# Patient Record
Sex: Male | Born: 1970 | Race: White | Hispanic: No | Marital: Single | Smoking: Current every day smoker
Health system: Southern US, Community
[De-identification: ages and names within clinical notes are randomized; demographics above are authoritative.]

## PROBLEM LIST (undated history)

## (undated) DIAGNOSIS — F329 Major depressive disorder, single episode, unspecified: Secondary | ICD-10-CM

## (undated) DIAGNOSIS — F419 Anxiety disorder, unspecified: Secondary | ICD-10-CM

## (undated) DIAGNOSIS — G2581 Restless legs syndrome: Secondary | ICD-10-CM

## (undated) DIAGNOSIS — R252 Cramp and spasm: Secondary | ICD-10-CM

## (undated) DIAGNOSIS — F32A Depression, unspecified: Secondary | ICD-10-CM

## (undated) HISTORY — DX: Restless legs syndrome: G25.81

## (undated) HISTORY — DX: Cramp and spasm: R25.2

---
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
## 2007-03-07 ENCOUNTER — Emergency Department: Payer: Self-pay | Admitting: Emergency Medicine

## 2009-05-01 ENCOUNTER — Emergency Department: Payer: Self-pay | Admitting: Emergency Medicine

## 2011-06-27 ENCOUNTER — Emergency Department: Payer: Self-pay | Admitting: Emergency Medicine

## 2017-12-26 ENCOUNTER — Other Ambulatory Visit: Payer: Self-pay

## 2017-12-26 ENCOUNTER — Encounter: Payer: Self-pay | Admitting: Gerontology

## 2017-12-26 ENCOUNTER — Ambulatory Visit: Payer: Self-pay | Admitting: Gerontology

## 2017-12-26 VITALS — BP 129/76 | HR 78 | Temp 97.7°F | Ht 72.0 in | Wt 154.6 lb

## 2017-12-26 DIAGNOSIS — H539 Unspecified visual disturbance: Secondary | ICD-10-CM

## 2017-12-26 DIAGNOSIS — H6121 Impacted cerumen, right ear: Secondary | ICD-10-CM

## 2017-12-26 DIAGNOSIS — Z0000 Encounter for general adult medical examination without abnormal findings: Secondary | ICD-10-CM

## 2017-12-26 DIAGNOSIS — K029 Dental caries, unspecified: Secondary | ICD-10-CM

## 2017-12-26 DIAGNOSIS — R6882 Decreased libido: Secondary | ICD-10-CM

## 2017-12-26 NOTE — Patient Instructions (Signed)
Carbamide Peroxide ear solution  What is this medicine?  CARBAMIDE PEROXIDE (CAR bah mide per OX ide) is used to soften and help remove ear wax.  This medicine may be used for other purposes; ask your health care provider or pharmacist if you have questions.  COMMON BRAND NAME(S): Auro Ear, Auro Earache Relief, Debrox, Ear Drops, Ear Wax Removal, Ear Wax Remover, Earwax Treatment, Murine, Thera-Ear  What should I tell my health care provider before I take this medicine?  They need to know if you have any of these conditions:  -dizziness  -ear discharge  -ear pain, irritation or rash  -infection  -perforated eardrum (hole in eardrum)  -an unusual or allergic reaction to carbamide peroxide, glycerin, hydrogen peroxide, other medicines, foods, dyes, or preservatives  -pregnant or trying to get pregnant  -breast-feeding  How should I use this medicine?  This medicine is only for use in the outer ear canal. Follow the directions carefully. Wash hands before and after use. The solution may be warmed by holding the bottle in the hand for 1 to 2 minutes. Lie with the affected ear facing upward. Place the proper number of drops into the ear canal. After the drops are instilled, remain lying with the affected ear upward for 5 minutes to help the drops stay in the ear canal. A cotton ball may be gently inserted at the ear opening for no longer than 5 to 10 minutes to ensure retention. Repeat, if necessary, for the opposite ear. Do not touch the tip of the dropper to the ear, fingertips, or other surface. Do not rinse the dropper after use. Keep container tightly closed.  Talk to your pediatrician regarding the use of this medicine in children. While this drug may be used in children as young as 12 years for selected conditions, precautions do apply.  Overdosage: If you think you have taken too much of this medicine contact a poison control center or emergency room at once.   NOTE: This medicine is only for you. Do not share this medicine with others.  What if I miss a dose?  If you miss a dose, use it as soon as you can. If it is almost time for your next dose, use only that dose. Do not use double or extra doses.  What may interact with this medicine?  Interactions are not expected. Do not use any other ear products without asking your doctor or health care professional.  This list may not describe all possible interactions. Give your health care provider a list of all the medicines, herbs, non-prescription drugs, or dietary supplements you use. Also tell them if you smoke, drink alcohol, or use illegal drugs. Some items may interact with your medicine.  What should I watch for while using this medicine?  This medicine is not for long-term use. Do not use for more than 4 days without checking with your health care professional. Contact your doctor or health care professional if your condition does not start to get better within a few days or if you notice burning, redness, itching or swelling.  What side effects may I notice from receiving this medicine?  Side effects that you should report to your doctor or health care professional as soon as possible:  -allergic reactions like skin rash, itching or hives, swelling of the face, lips, or tongue  -burning, itching, and redness  -worsening ear pain  -rash  Side effects that usually do not require medical attention (report to your doctor   or health care professional if they continue or are bothersome):  -abnormal sensation while putting the drops in the ear  -temporary reduction in hearing (but not complete loss of hearing)  This list may not describe all possible side effects. Call your doctor for medical advice about side effects. You may report side effects to FDA at 1-800-FDA-1088.  Where should I keep my medicine?  Keep out of the reach of children.  Store at room temperature between 15 and 30 degrees C (59 and 86 degrees  F) in a tight, light-resistant container. Keep bottle away from excessive heat and direct sunlight. Throw away any unused medicine after the expiration date.  NOTE: This sheet is a summary. It may not cover all possible information. If you have questions about this medicine, talk to your doctor, pharmacist, or health care provider.  © 2018 [employer]vier/Gold Standard (2007-06-03 14:00:02)

## 2017-12-26 NOTE — Progress Notes (Signed)
Patient: Albert Castillo Male    DOB: September 25, 1970   47 y.o.   MRN: [id number] Visit Date: 12/26/2017  Today's Provider: Langston Reusing, NP   Chief Complaint  Patient presents with  . New Patient (Initial Visit)    needs to see dentist, balance is off, low libido, difficulty seeing and hearing   Subjective:    HPI Albert Castillo 47 y/o male presents to establish care and discuss about issues with his balance , decrease hearing, vision changes and low libido. He states having trouble with near vision, and has not has eye exam in years.   He c/o having cavities and need to see a dentist.   C/o problem with hearing, and inability to coordination with his balance, states that he holds on to  rails while going down the steps. Denies ear pain, dizziness, tinnitus and fall.  He c/o decrease in sex drive, states that he can get erection but it's not as firm as before. He declines being in a relationship.   Stated used generic Viagra during the 2 years he had sexual intercourse.  He  smokes 1 pack of cigarette in 3 days and drinks about 1 pint a day if he can afford it.  Not on File Previous Medications   No medications on file    Review of Systems  Constitutional: Negative.   HENT: Positive for hearing loss.   Eyes: Positive for visual disturbance.  Respiratory: Negative.   Cardiovascular: Negative.   Gastrointestinal: Negative.   Endocrine: Negative.   Genitourinary: Negative.   Musculoskeletal: Negative.   Skin: Negative.   Neurological: Negative.   Psychiatric/Behavioral: Negative.     Social History   Tobacco Use  . Smoking status: Current Every Day Smoker    Types: Cigarettes  . Smokeless tobacco: Never Used  Substance Use Topics  . Alcohol use: Yes    Comment: drinks 1 pint of hard liquor every day   Objective:   BP 129/76 (BP Location: Right Arm, Patient Position: Sitting)   Pulse 78   Temp 97.7 F (36.5 C)   Ht 6' (1.829 m)   Wt 154 lb 9.6 oz (70.1 kg)    SpO2 99%   BMI 20.97 kg/m   Physical Exam  Constitutional: He is oriented to person, place, and time. He appears well-developed and well-nourished.  HENT:  Head: Normocephalic and atraumatic.  Right Ear: Decreased hearing is noted.  Left Ear: External ear normal.  Eyes: Pupils are equal, round, and reactive to light. Conjunctivae and EOM are normal.  Neck: Normal range of motion.  Cardiovascular: Normal rate and regular rhythm.  Pulmonary/Chest: Effort normal and breath sounds normal.  Abdominal: Soft. Bowel sounds are normal.  Musculoskeletal: Normal range of motion.  Neurological: He is alert and oriented to person, place, and time.  Skin: Skin is warm and dry. No rash noted. No erythema.  Psychiatric: He has a normal mood and affect.  Decreased hearing to right ear, impacted cerumen      Assessment & Plan:     1. Health care maintenance  - Comp Met (CMET) - CBC w/Diff - TSH - Lipid Profile - Urinalysis - HgB A1c  2. Dental cavities Dental referral  3. Changes in vision Eye check exam  4. Libido, decreased  - Testosterone level drawn  5. Hearing loss due to cerumen impaction, right  Cerumen to right eye, Debrox for cerumen impaction Follow up in 1 month       Albert Habib E Breyona Swander, NP  [facility]

## 2017-12-27 LAB — URINALYSIS
BILIRUBIN UA: NEGATIVE
Glucose, UA: NEGATIVE
KETONES UA: NEGATIVE
LEUKOCYTES UA: NEGATIVE
NITRITE UA: NEGATIVE
PH UA: 8.5 — AB (ref 5.0–7.5)
Protein, UA: NEGATIVE
RBC UA: NEGATIVE
Specific Gravity, UA: 1.024 (ref 1.005–1.030)
Urobilinogen, Ur: 2 mg/dL — ABNORMAL HIGH (ref 0.2–1.0)

## 2017-12-27 LAB — COMPREHENSIVE METABOLIC PANEL
A/G RATIO: 0.8 — AB (ref 1.2–2.2)
ALT: 38 IU/L (ref 0–44)
AST: 111 IU/L — AB (ref 0–40)
Albumin: 3.4 g/dL — ABNORMAL LOW (ref 3.5–5.5)
Alkaline Phosphatase: 134 IU/L — ABNORMAL HIGH (ref 39–117)
BILIRUBIN TOTAL: 1.7 mg/dL — AB (ref 0.0–1.2)
BUN/Creatinine Ratio: 13 (ref 9–20)
BUN: 8 mg/dL (ref 6–24)
CALCIUM: 8.9 mg/dL (ref 8.7–10.2)
CHLORIDE: 105 mmol/L (ref 96–106)
CO2: 23 mmol/L (ref 20–29)
Creatinine, Ser: 0.62 mg/dL — ABNORMAL LOW (ref 0.76–1.27)
GFR, EST AFRICAN AMERICAN: 138 mL/min/{1.73_m2} (ref >59)
GFR, EST NON AFRICAN AMERICAN: 119 mL/min/{1.73_m2} (ref >59)
GLOBULIN, TOTAL: 4.3 g/dL (ref 1.5–4.5)
Glucose: 88 mg/dL (ref 65–99)
POTASSIUM: 3.7 mmol/L (ref 3.5–5.2)
SODIUM: 142 mmol/L (ref 134–144)
TOTAL PROTEIN: 7.7 g/dL (ref 6.0–8.5)

## 2017-12-27 LAB — CBC WITH DIFFERENTIAL/PLATELET
BASOS ABS: 0.1 10*3/uL (ref 0.0–0.2)
BASOS: 2 %
EOS (ABSOLUTE): 0.3 10*3/uL (ref 0.0–0.4)
Eos: 3 %
Hematocrit: 33.6 % — ABNORMAL LOW (ref 37.5–51.0)
Hemoglobin: 12.2 g/dL — ABNORMAL LOW (ref 13.0–17.7)
Immature Grans (Abs): 0 10*3/uL (ref 0.0–0.1)
Immature Granulocytes: 0 %
LYMPHS ABS: 3.5 10*3/uL — AB (ref 0.7–3.1)
LYMPHS: 41 %
MCH: 32.6 pg (ref 26.6–33.0)
MCHC: 36.3 g/dL — AB (ref 31.5–35.7)
MCV: 90 fL (ref 79–97)
MONOS ABS: 0.8 10*3/uL (ref 0.1–0.9)
Monocytes: 9 %
NEUTROS ABS: 4 10*3/uL (ref 1.4–7.0)
Neutrophils: 45 %
PLATELETS: 115 10*3/uL — AB (ref 150–450)
RBC: 3.74 x10E6/uL — AB (ref 4.14–5.80)
RDW: 13.4 % (ref 12.3–15.4)
WBC: 8.7 10*3/uL (ref 3.4–10.8)

## 2017-12-27 LAB — HEMOGLOBIN A1C
Est. average glucose Bld gHb Est-mCnc: 85 mg/dL
HEMOGLOBIN A1C: 4.6 % — AB (ref 4.8–5.6)

## 2017-12-27 LAB — LIPID PANEL
CHOL/HDL RATIO: 2.3 ratio (ref 0.0–5.0)
Cholesterol, Total: 150 mg/dL (ref 100–199)
HDL: 65 mg/dL (ref >39)
LDL Calculated: 73 mg/dL (ref 0–99)
Triglycerides: 62 mg/dL (ref 0–149)
VLDL Cholesterol Cal: 12 mg/dL (ref 5–40)

## 2017-12-27 LAB — TESTOSTERONE: TESTOSTERONE: 747 ng/dL (ref 264–916)

## 2017-12-27 LAB — TSH: TSH: 1.32 u[IU]/mL (ref 0.450–4.500)

## 2018-01-23 ENCOUNTER — Encounter: Payer: Self-pay | Admitting: Gerontology

## 2018-01-23 ENCOUNTER — Ambulatory Visit: Payer: Self-pay | Admitting: Gerontology

## 2018-01-23 ENCOUNTER — Other Ambulatory Visit: Payer: Self-pay

## 2018-01-23 ENCOUNTER — Ambulatory Visit: Payer: Self-pay | Admitting: Ophthalmology

## 2018-01-23 VITALS — BP 115/68 | HR 72 | Ht 72.0 in | Wt 154.2 lb

## 2018-01-23 DIAGNOSIS — K029 Dental caries, unspecified: Secondary | ICD-10-CM

## 2018-01-23 DIAGNOSIS — H6121 Impacted cerumen, right ear: Secondary | ICD-10-CM

## 2018-01-23 DIAGNOSIS — H539 Unspecified visual disturbance: Secondary | ICD-10-CM

## 2018-01-23 DIAGNOSIS — Z0000 Encounter for general adult medical examination without abnormal findings: Secondary | ICD-10-CM

## 2018-01-23 MED ORDER — CARBAMIDE PEROXIDE 6.5 % OT SOLN: 5.0000 [drp] | Freq: Two times a day (BID) | OTIC | Status: DC

## 2018-01-23 NOTE — Patient Instructions (Signed)
carbaCarbamide Peroxide ear solution What is this medicine? CARBAMIDE PEROXIDE (CAR bah mide per OX ide) is used to soften and help remove ear wax. This medicine may be used for other purposes; ask your health care provider or pharmacist if you have questions. COMMON BRAND NAME(S): Auro Ear, Auro Earache Relief, Debrox, Ear Drops, Ear Wax Removal, Ear Wax Remover, Earwax Treatment, Murine, Thera-Ear What should I tell my health care provider before I take this medicine? They need to know if you have any of these conditions: -dizziness -ear discharge -ear pain, irritation or rash -infection -perforated eardrum (hole in eardrum) -an unusual or allergic reaction to carbamide peroxide, glycerin, hydrogen peroxide, other medicines, foods, dyes, or preservatives -pregnant or trying to get pregnant -breast-feeding How should I use this medicine? This medicine is only for use in the outer ear canal. Follow the directions carefully. Wash hands before and after use. The solution may be warmed by holding the bottle in the hand for 1 to 2 minutes. Lie with the affected ear facing upward. Place the proper number of drops into the ear canal. After the drops are instilled, remain lying with the affected ear upward for 5 minutes to help the drops stay in the ear canal. A cotton ball may be gently inserted at the ear opening for no longer than 5 to 10 minutes to ensure retention. Repeat, if necessary, for the opposite ear. Do not touch the tip of the dropper to the ear, fingertips, or other surface. Do not rinse the dropper after use. Keep container tightly closed. Talk to your pediatrician regarding the use of this medicine in children. While this drug may be used in children as young as 12 years for selected conditions, precautions do apply. Overdosage: If you think you have taken too much of this medicine contact a poison control center or emergency room at once. NOTE: This medicine is only for you. Do not  share this medicine with others. What if I miss a dose? If you miss a dose, use it as soon as you can. If it is almost time for your next dose, use only that dose. Do not use double or extra doses. What may interact with this medicine? Interactions are not expected. Do not use any other ear products without asking your doctor or health care professional. This list may not describe all possible interactions. Give your health care provider a list of all the medicines, herbs, non-prescription drugs, or dietary supplements you use. Also tell them if you smoke, drink alcohol, or use illegal drugs. Some items may interact with your medicine. What should I watch for while using this medicine? This medicine is not for long-term use. Do not use for more than 4 days without checking with your health care professional. Contact your doctor or health care professional if your condition does not start to get better within a few days or if you notice burning, redness, itching or swelling. What side effects may I notice from receiving this medicine? Side effects that you should report to your doctor or health care professional as soon as possible: -allergic reactions like skin rash, itching or hives, swelling of the face, lips, or tongue -burning, itching, and redness -worsening ear pain -rash Side effects that usually do not require medical attention (report to your doctor or health care professional if they continue or are bothersome): -abnormal sensation while putting the drops in the ear -temporary reduction in hearing (but not complete loss of hearing) This list may not  describe all possible side effects. Call your doctor for medical advice about side effects. You may report side effects to FDA at 1-800-FDA-1088. Where should I keep my medicine? Keep out of the reach of children. Store at room temperature between 15 and 30 degrees C (59 and 86 degrees F) in a tight, light-resistant container. Keep bottle  away from excessive heat and direct sunlight. Throw away any unused medicine after the expiration date. NOTE: This sheet is a summary. It may not cover all possible information. If you have questions about this medicine, talk to your doctor, pharmacist, or health care provider.  2018 Elsevier/Gold Standard (2007-06-03 14:00:02)

## 2018-01-23 NOTE — Progress Notes (Signed)
Established Patient Office Visit  Subjective:  Patient ID: Albert Castillo, male    DOB: 1970-10-15  Age: 47 y.o. MRN: [id number]  CC:  Chief Complaint  Patient presents with  . Follow-up    continued teeth concerns    HPI Albert Castillo presents for follow up on his dental cavities, and lab review. He reports that he stopped drinking liquor, and his energy level and balance has improved. He also reports improvement in hearing through his right ear after using debrox for impacted cerumen to right ear.  He continue to complain of tooth ache, and awaits dental referral. He reports  smoking 7 cigarettes a day and desires to quit. He c/o visual changes with reading and has not had recent eye exam. Otherwise reports doing well, denies chest pain, palpitation, shortness of breath.  Lab reviewed, AST 111, he reports not drinking liquor. hgb 12.1 g/dl and hct 33.6 %.  Past Medical History:  Diagnosis Date  . Leg cramps   . Restless leg syndrome     History reviewed. No pertinent surgical history.  History reviewed. No pertinent family history.  Social History   Socioeconomic History  . Marital status: Single    Spouse name: Not on file  . Number of children: Not on file  . Years of education: Not on file  . Highest education level: Not on file  Occupational History  . Not on file  Social Needs  . Financial resource strain: Not on file  . Food insecurity:    Worry: Never true    Inability: Not on file  . Transportation needs:    Medical: No    Non-medical: Not on file  Tobacco Use  . Smoking status: Current Every Day Smoker    Types: Cigarettes  . Smokeless tobacco: Never Used  Substance and Sexual Activity  . Alcohol use: Yes    Comment: drinks 1 pint of hard liquor every day  . Drug use: Never  . Sexual activity: Not on file  Lifestyle  . Physical activity:    Days per week: Not on file    Minutes per session: Not on file  . Stress: Very much  Relationships  .  Social connections:    Talks on phone: Not on file    Gets together: Not on file    Attends religious service: Not on file    Active member of club or organization: Not on file    Attends meetings of clubs or organizations: Not on file    Relationship status: Not on file  . Intimate partner violence:    Fear of current or ex partner: Not on file    Emotionally abused: Not on file    Physically abused: Not on file    Forced sexual activity: Not on file  Other Topics Concern  . Not on file  Social History Narrative  . Not on file    No outpatient medications prior to visit.   No facility-administered medications prior to visit.     No Known Allergies  ROS Review of Systems  Constitutional: Negative.   HENT: Positive for dental problem (cavities).   Eyes: Positive for visual disturbance (blurred with reading).  Respiratory: Negative.   Cardiovascular: Negative.   Gastrointestinal: Negative.   Genitourinary: Negative.   Musculoskeletal: Negative.   Skin: Negative.   Neurological: Negative.   Psychiatric/Behavioral: Negative.       Objective:    Physical Exam  Constitutional: He is oriented to person, place, and  time. He appears well-developed and well-nourished.  HENT:  Head: Normocephalic and atraumatic.  Left Ear: Tympanic membrane is not retracted.  Ears:  Eyes: Pupils are equal, round, and reactive to light. EOM are normal.  Neck: Normal range of motion.  Cardiovascular: Normal rate and regular rhythm.  Pulmonary/Chest: Effort normal and breath sounds normal.  Abdominal: Soft. Bowel sounds are normal.  Musculoskeletal: Normal range of motion.  Neurological: He is alert and oriented to person, place, and time.  Skin: Skin is warm and dry.  Psychiatric: He has a normal mood and affect. His behavior is normal. Judgment and thought content normal.    BP 115/68 (BP Location: Left Arm, Patient Position: Sitting)   Pulse 72   Ht 6' (1.829 m)   Wt 154 lb 3.2 oz  (69.9 kg)   SpO2 100%   BMI 20.91 kg/m  Wt Readings from Last 3 Encounters:  01/23/18 154 lb 3.2 oz (69.9 kg)  12/26/17 154 lb 9.6 oz (70.1 kg)     Health Maintenance Due  Topic Date Due  . HIV Screening  09/19/1986  . TETANUS/TDAP  09/19/1990  . INFLUENZA VACCINE  10/03/2017    There are no preventive care reminders to display for this patient.  Lab Results  Component Value Date   TSH 1.320 12/26/2017   Lab Results  Component Value Date   WBC 8.7 12/26/2017   HGB 12.2 (L) 12/26/2017   HCT 33.6 (L) 12/26/2017   MCV 90 12/26/2017   PLT 115 (L) 12/26/2017   Lab Results  Component Value Date   NA 142 12/26/2017   K 3.7 12/26/2017   CO2 23 12/26/2017   GLUCOSE 88 12/26/2017   BUN 8 12/26/2017   CREATININE 0.62 (L) 12/26/2017   BILITOT 1.7 (H) 12/26/2017   ALKPHOS 134 (H) 12/26/2017   AST 111 (H) 12/26/2017   ALT 38 12/26/2017   PROT 7.7 12/26/2017   ALBUMIN 3.4 (L) 12/26/2017   CALCIUM 8.9 12/26/2017   Lab Results  Component Value Date   CHOL 150 12/26/2017   Lab Results  Component Value Date   HDL 65 12/26/2017   Lab Results  Component Value Date   LDLCALC 73 12/26/2017   Lab Results  Component Value Date   TRIG 62 12/26/2017   Lab Results  Component Value Date   CHOLHDL 2.3 12/26/2017   Lab Results  Component Value Date   HGBA1C 4.6 (L) 12/26/2017      Assessment & Plan:   Problem List Items Addressed This Visit    None    1. Dental cavities - Dental appointment scheduled  2. Changes in vision - Eye exam scheduled  3. Health care maintenance  - Comp Met (CMET); Future -cbc/with diff, future -encouraged smoking cessation, Quitline [place] provided - Encouraged on alcohol abstinence -HIV screening information provided -Declined flu vaccine   4. Impacted cerumen of right ear  - He was advised to use carbamide peroxide (DEBROX) 6.5 % OTIC (EAR) solution 5 drop   No orders of the defined types were placed in this  encounter.   Follow-up: No follow-ups on file.  Follow up in 6 months  Arine Foley E Hattie Perch, NP

## 2018-07-24 ENCOUNTER — Ambulatory Visit: Payer: Self-pay | Admitting: Gerontology

## 2018-07-24 NOTE — Progress Notes (Deleted)
Established Patient Office Visit  Subjective:  Patient ID: Albert Castillo, male    DOB: November 19, 1970  Age: 48 y.o. MRN: [id number]  CC: No chief complaint on file.   HPI Albert Castillo presents for follow up for dental cavities,  Past Medical History:  Diagnosis Date  . Leg cramps   . Restless leg syndrome     No past surgical history on file.  No family history on file.  Social History   Socioeconomic History  . Marital status: Single    Spouse name: Not on file  . Number of children: Not on file  . Years of education: Not on file  . Highest education level: Not on file  Occupational History  . Not on file  Social Needs  . Financial resource strain: Not on file  . Food insecurity:    Worry: Never true    Inability: Not on file  . Transportation needs:    Medical: No    Non-medical: Not on file  Tobacco Use  . Smoking status: Current Every Day Smoker    Types: Cigarettes  . Smokeless tobacco: Never Used  Substance and Sexual Activity  . Alcohol use: Yes    Comment: drinks 1 pint of hard liquor every day  . Drug use: Never  . Sexual activity: Not on file  Lifestyle  . Physical activity:    Days per week: Not on file    Minutes per session: Not on file  . Stress: Very much  Relationships  . Social connections:    Talks on phone: Not on file    Gets together: Not on file    Attends religious service: Not on file    Active member of club or organization: Not on file    Attends meetings of clubs or organizations: Not on file    Relationship status: Not on file  . Intimate partner violence:    Fear of current or ex partner: Not on file    Emotionally abused: Not on file    Physically abused: Not on file    Forced sexual activity: Not on file  Other Topics Concern  . Not on file  Social History Narrative  . Not on file    No outpatient medications prior to visit.   Facility-Administered Medications Prior to Visit  Medication Dose Route Frequency  Provider Last Rate Last Dose  . carbamide peroxide (DEBROX) 6.5 % OTIC (EAR) solution 5 drop  5 drop Right EAR BID Jacky Hartung E, NP        No Known Allergies  ROS Review of Systems    Objective:    Physical Exam  There were no vitals taken for this visit. Wt Readings from Last 3 Encounters:  01/23/18 154 lb 3.2 oz (69.9 kg)  12/26/17 154 lb 9.6 oz (70.1 kg)     Health Maintenance Due  Topic Date Due  . HIV Screening  09/19/1986  . TETANUS/TDAP  09/19/1990    There are no preventive care reminders to display for this patient.  Lab Results  Component Value Date   TSH 1.320 12/26/2017   Lab Results  Component Value Date   WBC 8.7 12/26/2017   HGB 12.2 (L) 12/26/2017   HCT 33.6 (L) 12/26/2017   MCV 90 12/26/2017   PLT 115 (L) 12/26/2017   Lab Results  Component Value Date   NA 142 12/26/2017   K 3.7 12/26/2017   CO2 23 12/26/2017   GLUCOSE 88 12/26/2017   BUN  8 12/26/2017   CREATININE 0.62 (L) 12/26/2017   BILITOT 1.7 (H) 12/26/2017   ALKPHOS 134 (H) 12/26/2017   AST 111 (H) 12/26/2017   ALT 38 12/26/2017   PROT 7.7 12/26/2017   ALBUMIN 3.4 (L) 12/26/2017   CALCIUM 8.9 12/26/2017   Lab Results  Component Value Date   CHOL 150 12/26/2017   Lab Results  Component Value Date   HDL 65 12/26/2017   Lab Results  Component Value Date   LDLCALC 73 12/26/2017   Lab Results  Component Value Date   TRIG 62 12/26/2017   Lab Results  Component Value Date   CHOLHDL 2.3 12/26/2017   Lab Results  Component Value Date   HGBA1C 4.6 (L) 12/26/2017      Assessment & Plan:   Problem List Items Addressed This Visit    None      No orders of the defined types were placed in this encounter.   Follow-up: No follow-ups on file.    Seleny Allbright Trellis Paganini, NP

## 2018-11-30 ENCOUNTER — Encounter: Payer: Self-pay | Admitting: Emergency Medicine

## 2018-11-30 ENCOUNTER — Emergency Department
Admission: EM | Admit: 2018-11-30 | Discharge: 2018-11-30 | Disposition: A | Discharge status: Home / Self Care | Payer: Self-pay | Attending: Emergency Medicine | Admitting: Emergency Medicine

## 2018-11-30 ENCOUNTER — Emergency Department: Payer: Self-pay

## 2018-11-30 ENCOUNTER — Other Ambulatory Visit: Payer: Self-pay

## 2018-11-30 DIAGNOSIS — R29898 Other symptoms and signs involving the musculoskeletal system: Secondary | ICD-10-CM | POA: Insufficient documentation

## 2018-11-30 DIAGNOSIS — M79651 Pain in right thigh: Secondary | ICD-10-CM | POA: Insufficient documentation

## 2018-11-30 DIAGNOSIS — M79652 Pain in left thigh: Secondary | ICD-10-CM | POA: Insufficient documentation

## 2018-11-30 DIAGNOSIS — G8929 Other chronic pain: Secondary | ICD-10-CM | POA: Insufficient documentation

## 2018-11-30 DIAGNOSIS — Z8669 Personal history of other diseases of the nervous system and sense organs: Secondary | ICD-10-CM

## 2018-11-30 DIAGNOSIS — F17210 Nicotine dependence, cigarettes, uncomplicated: Secondary | ICD-10-CM | POA: Insufficient documentation

## 2018-11-30 NOTE — ED Notes (Signed)
Pt called to inquire about old med on chart from [facility] in 2019. Pt was confused and thought he should've received the medicine before d/c. Explained. Pt requested prescription for pain. Educated cannot receive this once d/c and would need to come back and be seen again to potentially have prescription written.

## 2018-11-30 NOTE — ED Provider Notes (Signed)
[facility] EMERGENCY DEPARTMENT Provider Note   CSN: [id number] Arrival date & time: 11/30/18  0746     History   Chief Complaint Chief Complaint  Patient presents with   Leg Pain   Extremity Weakness    HPI Albert Castillo is a 48 y.o. male presents to the emergency department for evaluation of bilateral leg pain and weakness.  Patient states for at least 6 months he has had pain and weakness in both legs.  He describes sharp shooting pain in both thighs, groin to the knee.  He feels as if both of his legs give way with walking.  He denies any trauma or injury.  [facility] for [facility] same complaint, had negative MRI of the brain along with evaluation of lab work.  Patient was urged to follow-up with neurologist but is yet to follow-up.  Today, patient complains of continued pain with no improvement.  He denies any worsening symptoms or worsening weakness.  No loss of bowel or bladder symptoms.  He denies any back pain numbness tingling or radicular symptoms.  He complains of aching pain in both anterior thighs to the knee that is worse with sitting and improved after he first stands up and ambulates.  He ambulates with no assistive devices.  He takes occasional Tylenol and ibuprofen.  Pain is currently mild.  He denies any fevers.  Patient does admit to a history of alcoholism.   HPI  Past Medical History:  Diagnosis Date   Leg cramps    Restless leg syndrome     There are no active problems to display for this patient.   History reviewed. No pertinent surgical history.      Home Medications    Prior to Admission medications   Not on File    Family History No family history on file.  Social History Social History   Tobacco Use   Smoking status: Current Every Day Smoker    Types: Cigarettes   Smokeless tobacco: Never Used  Substance Use Topics   Alcohol use: Yes    Comment: drinks 1 pint of hard liquor every day   Drug  use: Never     Allergies   Patient has no known allergies.   Review of Systems Review of Systems  Constitutional: Negative for fever.  Respiratory: Negative for shortness of breath.   Cardiovascular: Negative for chest pain.  Gastrointestinal: Negative for abdominal pain.  Genitourinary: Negative for dysuria, frequency and hematuria.  Musculoskeletal: Positive for gait problem and myalgias. Negative for arthralgias, back pain, neck pain and neck stiffness.  Skin: Negative for rash and wound.  Neurological: Negative for dizziness, light-headedness and headaches.  Hematological: Bruises/bleeds easily.  Psychiatric/Behavioral: Negative for confusion.     Physical Exam Updated Vital Signs BP 133/72 (BP Location: Left Arm)    Pulse 81    Temp 98.2 F (36.8 C) (Oral)    Resp 16    Ht 6' (1.829 m)    Wt 70.3 kg    SpO2 100%    BMI 21.02 kg/m   Physical Exam Constitutional:      Appearance: He is well-developed.     Comments: Normal gait with no antalgic component.  No assistive devices with ambulation.  HENT:     Head: Normocephalic and atraumatic.  Eyes:     Conjunctiva/sclera: Conjunctivae normal.  Neck:     Musculoskeletal: Normal range of motion.  Cardiovascular:     Rate and Rhythm: Normal  rate.  Pulmonary:     Effort: Pulmonary effort is normal. No respiratory distress.  Abdominal:     General: There is no distension.     Tenderness: There is no abdominal tenderness. There is no guarding.  Musculoskeletal: Normal range of motion.     Comments: Lumbar Spine: Examination of the lumbar spine reveals no bony abnormality, no edema, and no ecchymosis.  There is no step off.  The patient has full range of motion of the lumbar spine with flexion and extension.  The patient has normal lateral bend and rotation.  The patient has no pain with range of motion activities.  The patient has a negative axial load test, and a negative rotational Waddell test.  The patient is non tender  along the spinous process.  The patient is non tender along the paravertebral muscles, with no muscle spasms.  The patient is non tender along the iliac crest.  The patient is non tender in the sciatic notch.  The patient is non tender along the Sacroiliac joint.  There is no Coccyx joint tenderness.    Bilateral Lower Extremities: Examination of the lower extremities reveals no bony abnormality, no edema, and no ecchymosis.  The patient has full active and passive range of motion of the hips, knees, and ankles.  No discomfort with active or passive range of motion of the hips knees and ankles bilaterally.  The patient is non tender along the greater trochanter region.  The patient has a negative Denna Haggard' test bilaterally.  There is normal skin warmth.  There is normal capillary refill bilaterally.    Neurologic: The patient has a negative straight leg raise.  The patient has normal muscle strength testing for the quadriceps, calves, ankle dorsiflexion, ankle plantarflexion, and extensor hallicus longus.  The patient has sensation that is intact to light touch.  Patellar reflexes are normal bilaterally with no clonus noted.  Skin:    General: Skin is warm.     Findings: No rash.  Neurological:     General: No focal deficit present.     Mental Status: He is alert and oriented to person, place, and time. Mental status is at baseline.     Cranial Nerves: No cranial nerve deficit.     Motor: No weakness.  Psychiatric:        Behavior: Behavior normal.        Thought Content: Thought content normal.      ED Treatments / Results  Labs (all labs ordered are listed, but only abnormal results are displayed) Labs Reviewed - No data to display  EKG None  Radiology Dg Pelvis 1-2 Views  Result Date: 11/30/2018 CLINICAL DATA:  Bilateral thigh pain for 6 months without known injury. EXAM: PELVIS - 1-2 VIEW COMPARISON:  None. FINDINGS: There is no evidence of pelvic fracture or diastasis. No pelvic  bone lesions are seen. No definite evidence of avascular necrosis. IMPRESSION: Negative. Electronically Signed   By: Lupita Raider M.D.   On: 11/30/2018 08:42    Procedures Procedures (including critical care time)  Medications Ordered in ED Medications - No data to display   Initial Impression / Assessment and Plan / ED Course  I have reviewed the triage vital signs and the nursing notes.  Pertinent labs & imaging results that were available during my care of the patient were reviewed by me and considered in my medical decision making (see chart for details).        48 year old male with  complaints of bilateral thigh pain and feeling as if his legs are giving way.  Symptoms been present for 6 months with no change.  He has had a negative MRI of the brain at [facility] 2 months ago.  Was encouraged to follow-up with neurology but is yet to follow-up.  X-rays of the pelvis today showed no signs of arthropathy or abnormal bony lesions.  No AVN.  He does suffer from alcoholism, symptoms could be coming from neuropathy.  He has no signs of blood clots.  His vital signs are stable.  No neurological deficits noted in the lower extremities.  He is encouraged to follow-up with neurology and he understands signs and symptoms return to ED for.  Final Clinical Impressions(s) / ED Diagnoses   Final diagnoses:  Bilateral thigh pain  Leg weakness, bilateral  Chronic pain of both lower extremities  History of sciatica    ED Discharge Orders    None       Renata Caprice 11/30/18 [id number]    Vanessa [place], MD 11/30/18 [phone]

## 2018-11-30 NOTE — Discharge Instructions (Addendum)
Please call neurology office to schedule follow-up appointment.  Return to the ER for any increasing pain weakness worsening symptoms or urgent changes in her health.

## 2018-11-30 NOTE — ED Notes (Signed)
Pt c/o weakness in both legs x6 months. Pt also reports hx of sciatic nerve pain.

## 2018-11-30 NOTE — ED Triage Notes (Signed)
Pt to ED via POV stating that he is having pain and weakness in his legs x 6 months. Pt is in NAD at this time.

## 2018-12-24 ENCOUNTER — Encounter: Payer: Self-pay | Admitting: Emergency Medicine

## 2018-12-24 ENCOUNTER — Other Ambulatory Visit: Payer: Self-pay

## 2018-12-24 DIAGNOSIS — Z820 Family history of epilepsy and other diseases of the nervous system: Secondary | ICD-10-CM

## 2018-12-24 DIAGNOSIS — G2581 Restless legs syndrome: Secondary | ICD-10-CM | POA: Diagnosis present

## 2018-12-24 DIAGNOSIS — F17210 Nicotine dependence, cigarettes, uncomplicated: Secondary | ICD-10-CM | POA: Diagnosis present

## 2018-12-24 DIAGNOSIS — K529 Noninfective gastroenteritis and colitis, unspecified: Secondary | ICD-10-CM | POA: Diagnosis present

## 2018-12-24 DIAGNOSIS — E876 Hypokalemia: Secondary | ICD-10-CM | POA: Diagnosis present

## 2018-12-24 DIAGNOSIS — D649 Anemia, unspecified: Secondary | ICD-10-CM | POA: Diagnosis present

## 2018-12-24 DIAGNOSIS — K7031 Alcoholic cirrhosis of liver with ascites: Principal | ICD-10-CM | POA: Diagnosis present

## 2018-12-24 DIAGNOSIS — K7011 Alcoholic hepatitis with ascites: Secondary | ICD-10-CM | POA: Diagnosis present

## 2018-12-24 DIAGNOSIS — Z20828 Contact with and (suspected) exposure to other viral communicable diseases: Secondary | ICD-10-CM | POA: Diagnosis present

## 2018-12-24 DIAGNOSIS — D6959 Other secondary thrombocytopenia: Secondary | ICD-10-CM | POA: Diagnosis present

## 2018-12-24 DIAGNOSIS — F1010 Alcohol abuse, uncomplicated: Secondary | ICD-10-CM | POA: Diagnosis present

## 2018-12-24 DIAGNOSIS — Z716 Tobacco abuse counseling: Secondary | ICD-10-CM

## 2018-12-24 NOTE — ED Triage Notes (Signed)
Patient ambulatory to triage with steady gait, without difficulty or distress noted, mask in place; pt st x 5 days having abd pain/swelling accomp by N/V/D

## 2018-12-25 ENCOUNTER — Inpatient Hospital Stay
Admission: EM | Admit: 2018-12-25 | Discharge: 2018-12-26 | DRG: 434 | Disposition: A | Discharge status: Home / Self Care | Payer: Self-pay | Attending: Internal Medicine | Admitting: Internal Medicine

## 2018-12-25 ENCOUNTER — Emergency Department: Payer: Self-pay

## 2018-12-25 ENCOUNTER — Other Ambulatory Visit (HOSPITAL_COMMUNITY): Payer: Self-pay

## 2018-12-25 ENCOUNTER — Inpatient Hospital Stay: Payer: Self-pay

## 2018-12-25 DIAGNOSIS — F10939 Alcohol use, unspecified with withdrawal, unspecified: Secondary | ICD-10-CM

## 2018-12-25 DIAGNOSIS — R140 Abdominal distension (gaseous): Secondary | ICD-10-CM

## 2018-12-25 DIAGNOSIS — H6121 Impacted cerumen, right ear: Secondary | ICD-10-CM

## 2018-12-25 DIAGNOSIS — F10239 Alcohol dependence with withdrawal, unspecified: Secondary | ICD-10-CM

## 2018-12-25 DIAGNOSIS — R109 Unspecified abdominal pain: Secondary | ICD-10-CM | POA: Diagnosis present

## 2018-12-25 DIAGNOSIS — R188 Other ascites: Secondary | ICD-10-CM

## 2018-12-25 DIAGNOSIS — K7031 Alcoholic cirrhosis of liver with ascites: Principal | ICD-10-CM

## 2018-12-25 HISTORY — DX: Anxiety disorder, unspecified: F41.9

## 2018-12-25 HISTORY — DX: Depression, unspecified: F32.A

## 2018-12-25 LAB — CBC WITH DIFFERENTIAL/PLATELET
Abs Immature Granulocytes: 0.02 10*3/uL (ref 0.00–0.07)
Basophils Absolute: 0.1 10*3/uL (ref 0.0–0.1)
Basophils Relative: 1 %
Eosinophils Absolute: 0.1 10*3/uL (ref 0.0–0.5)
Eosinophils Relative: 1 %
HCT: 32.2 % — ABNORMAL LOW (ref 39.0–52.0)
Hemoglobin: 11.3 g/dL — ABNORMAL LOW (ref 13.0–17.0)
Immature Granulocytes: 0 %
Lymphocytes Relative: 25 %
Lymphs Abs: 1.7 10*3/uL (ref 0.7–4.0)
MCH: 34.6 pg — ABNORMAL HIGH (ref 26.0–34.0)
MCHC: 35.1 g/dL (ref 30.0–36.0)
MCV: 98.5 fL (ref 80.0–100.0)
Monocytes Absolute: 0.7 10*3/uL (ref 0.1–1.0)
Monocytes Relative: 10 %
Neutro Abs: 4.4 10*3/uL (ref 1.7–7.7)
Neutrophils Relative %: 63 %
Platelets: 66 10*3/uL — ABNORMAL LOW (ref 150–400)
RBC: 3.27 MIL/uL — ABNORMAL LOW (ref 4.22–5.81)
RDW: 16.3 % — ABNORMAL HIGH (ref 11.5–15.5)
Smear Review: DECREASED
WBC: 6.9 10*3/uL (ref 4.0–10.5)
nRBC: 0 % (ref 0.0–0.2)

## 2018-12-25 LAB — COMPREHENSIVE METABOLIC PANEL
ALT: 46 U/L — ABNORMAL HIGH (ref 0–44)
ALT: 53 U/L — ABNORMAL HIGH (ref 0–44)
AST: 107 U/L — ABNORMAL HIGH (ref 15–41)
AST: 133 U/L — ABNORMAL HIGH (ref 15–41)
Albumin: 2.2 g/dL — ABNORMAL LOW (ref 3.5–5.0)
Albumin: 2.5 g/dL — ABNORMAL LOW (ref 3.5–5.0)
Alkaline Phosphatase: 85 U/L (ref 38–126)
Alkaline Phosphatase: 99 U/L (ref 38–126)
Anion gap: 9 (ref 5–15)
Anion gap: 9 (ref 5–15)
BUN: 5 mg/dL — ABNORMAL LOW (ref 6–20)
BUN: 6 mg/dL (ref 6–20)
CO2: 26 mmol/L (ref 22–32)
CO2: 26 mmol/L (ref 22–32)
Calcium: 7.8 mg/dL — ABNORMAL LOW (ref 8.9–10.3)
Calcium: 8.2 mg/dL — ABNORMAL LOW (ref 8.9–10.3)
Chloride: 100 mmol/L (ref 98–111)
Chloride: 101 mmol/L (ref 98–111)
Creatinine, Ser: 0.69 mg/dL (ref 0.61–1.24)
Creatinine, Ser: 0.73 mg/dL (ref 0.61–1.24)
GFR calc Af Amer: >60 mL/min (ref >60)
GFR calc Af Amer: >60 mL/min (ref >60)
GFR calc non Af Amer: >60 mL/min (ref >60)
GFR calc non Af Amer: >60 mL/min (ref >60)
Glucose, Bld: 109 mg/dL — ABNORMAL HIGH (ref 70–99)
Glucose, Bld: 126 mg/dL — ABNORMAL HIGH (ref 70–99)
Potassium: 3.4 mmol/L — ABNORMAL LOW (ref 3.5–5.1)
Potassium: 3.8 mmol/L (ref 3.5–5.1)
Sodium: 135 mmol/L (ref 135–145)
Sodium: 136 mmol/L (ref 135–145)
Total Bilirubin: 6.8 mg/dL — ABNORMAL HIGH (ref 0.3–1.2)
Total Bilirubin: 7.8 mg/dL — ABNORMAL HIGH (ref 0.3–1.2)
Total Protein: 6.4 g/dL — ABNORMAL LOW (ref 6.5–8.1)
Total Protein: 7.4 g/dL (ref 6.5–8.1)

## 2018-12-25 LAB — URINALYSIS, COMPLETE (UACMP) WITH MICROSCOPIC
Bacteria, UA: NONE SEEN
Specific Gravity, Urine: 1.027 (ref 1.005–1.030)
Squamous Epithelial / HPF: NONE SEEN (ref 0–5)

## 2018-12-25 LAB — AMYLASE, PLEURAL OR PERITONEAL FLUID: Amylase, Fluid: 14 U/L

## 2018-12-25 LAB — LACTATE DEHYDROGENASE, PLEURAL OR PERITONEAL FLUID: LD, Fluid: 58 U/L — ABNORMAL HIGH (ref 3–23)

## 2018-12-25 LAB — BODY FLUID CELL COUNT WITH DIFFERENTIAL
Eos, Fluid: 0 %
Lymphs, Fluid: 22 %
Monocyte-Macrophage-Serous Fluid: 68 %
Neutrophil Count, Fluid: 10 %
Total Nucleated Cell Count, Fluid: 310 cu mm

## 2018-12-25 LAB — HIV ANTIBODY (ROUTINE TESTING W REFLEX): HIV Screen 4th Generation wRfx: NONREACTIVE

## 2018-12-25 LAB — PROTIME-INR
INR: 2.2 — ABNORMAL HIGH (ref 0.8–1.2)
Prothrombin Time: 24.4 seconds — ABNORMAL HIGH (ref 11.4–15.2)

## 2018-12-25 LAB — GLUCOSE, PLEURAL OR PERITONEAL FLUID: Glucose, Fluid: 124 mg/dL

## 2018-12-25 LAB — MAGNESIUM: Magnesium: 1.4 mg/dL — ABNORMAL LOW (ref 1.7–2.4)

## 2018-12-25 LAB — CBC
HCT: 29.5 % — ABNORMAL LOW (ref 39.0–52.0)
Hemoglobin: 10.2 g/dL — ABNORMAL LOW (ref 13.0–17.0)
MCH: 34.1 pg — ABNORMAL HIGH (ref 26.0–34.0)
MCHC: 34.6 g/dL (ref 30.0–36.0)
MCV: 98.7 fL (ref 80.0–100.0)
Platelets: 57 10*3/uL — ABNORMAL LOW (ref 150–400)
RBC: 2.99 MIL/uL — ABNORMAL LOW (ref 4.22–5.81)
RDW: 16.6 % — ABNORMAL HIGH (ref 11.5–15.5)
WBC: 6.1 10*3/uL (ref 4.0–10.5)
nRBC: 0 % (ref 0.0–0.2)

## 2018-12-25 LAB — LIPASE, BLOOD: Lipase: 24 U/L (ref 11–51)

## 2018-12-25 LAB — AMMONIA: Ammonia: 16 umol/L (ref 9–35)

## 2018-12-25 LAB — SARS CORONAVIRUS 2 (TAT 6-24 HRS): SARS Coronavirus 2: NEGATIVE

## 2018-12-25 LAB — ALBUMIN, PLEURAL OR PERITONEAL FLUID: Albumin, Fluid: <1 g/dL

## 2018-12-25 LAB — PROTEIN, PLEURAL OR PERITONEAL FLUID: Total protein, fluid: <3 g/dL

## 2018-12-25 MED ORDER — ADULT MULTIVITAMIN W/MINERALS CH
1.0000 | ORAL_TABLET | Freq: Every day | ORAL | Status: DC
  Administered 2018-12-25 – 2018-12-26 (×2): 1 via ORAL
  Filled 2018-12-25 (×2): qty 1

## 2018-12-25 MED ORDER — LORAZEPAM 2 MG/ML IJ SOLN
0.0000 mg | Freq: Four times a day (QID) | INTRAMUSCULAR | Status: DC
  Administered 2018-12-25: 2 mg via INTRAVENOUS
  Filled 2018-12-25: qty 1

## 2018-12-25 MED ORDER — LORAZEPAM 2 MG/ML IJ SOLN: 1.0000 mg | INTRAMUSCULAR | Status: DC | PRN

## 2018-12-25 MED ORDER — LORAZEPAM BOLUS VIA INFUSION: 1.0000 mg | INTRAVENOUS | Status: DC | PRN

## 2018-12-25 MED ORDER — ONDANSETRON HCL 4 MG/2ML IJ SOLN
4.0000 mg | Freq: Once | INTRAMUSCULAR | Status: AC
  Administered 2018-12-25: 03:00:00 4 mg via INTRAVENOUS
  Filled 2018-12-25: qty 2

## 2018-12-25 MED ORDER — THIAMINE HCL 100 MG/ML IJ SOLN: 100.0000 mg | Freq: Every day | INTRAMUSCULAR | Status: DC

## 2018-12-25 MED ORDER — FUROSEMIDE 40 MG PO TABS
40.0000 mg | ORAL_TABLET | Freq: Every day | ORAL | Status: DC
  Administered 2018-12-25 – 2018-12-26 (×2): 40 mg via ORAL
  Filled 2018-12-25 (×2): qty 1

## 2018-12-25 MED ORDER — SODIUM CHLORIDE 0.9 % IV BOLUS
1000.0000 mL | Freq: Once | INTRAVENOUS | Status: AC
  Administered 2018-12-25: 1000 mL via INTRAVENOUS

## 2018-12-25 MED ORDER — MORPHINE SULFATE (PF) 2 MG/ML IV SOLN
2.0000 mg | Freq: Once | INTRAVENOUS | Status: AC
  Administered 2018-12-25: 2 mg via INTRAVENOUS
  Filled 2018-12-25: qty 1

## 2018-12-25 MED ORDER — PANTOPRAZOLE SODIUM 40 MG IV SOLR
40.0000 mg | Freq: Two times a day (BID) | INTRAVENOUS | Status: DC
  Administered 2018-12-25: 40 mg via INTRAVENOUS
  Filled 2018-12-25: qty 40

## 2018-12-25 MED ORDER — NICOTINE 14 MG/24HR TD PT24
14.0000 mg | MEDICATED_PATCH | Freq: Every day | TRANSDERMAL | Status: DC
  Administered 2018-12-25 – 2018-12-26 (×2): 14 mg via TRANSDERMAL
  Filled 2018-12-25 (×2): qty 1

## 2018-12-25 MED ORDER — THIAMINE HCL 100 MG/ML IJ SOLN
Freq: Once | INTRAVENOUS | Status: AC
  Administered 2018-12-25: 07:00:00 via INTRAVENOUS
  Filled 2018-12-25: qty 1000

## 2018-12-25 MED ORDER — POTASSIUM CHLORIDE IN NACL 20-0.9 MEQ/L-% IV SOLN
INTRAVENOUS | Status: DC
  Administered 2018-12-25: 05:00:00 via INTRAVENOUS
  Filled 2018-12-25 (×2): qty 1000

## 2018-12-25 MED ORDER — LORAZEPAM 2 MG/ML IJ SOLN: 0.0000 mg | Freq: Two times a day (BID) | INTRAMUSCULAR | Status: DC

## 2018-12-25 MED ORDER — SPIRONOLACTONE 25 MG PO TABS
25.0000 mg | ORAL_TABLET | Freq: Every day | ORAL | Status: DC
  Administered 2018-12-25 – 2018-12-26 (×2): 25 mg via ORAL
  Filled 2018-12-25 (×2): qty 1

## 2018-12-25 MED ORDER — LORAZEPAM 2 MG PO TABS
0.0000 mg | ORAL_TABLET | Freq: Four times a day (QID) | ORAL | Status: DC
  Administered 2018-12-26: 2 mg via ORAL
  Filled 2018-12-25: qty 1

## 2018-12-25 MED ORDER — SODIUM CHLORIDE 0.9 % IV SOLN
2.0000 g | INTRAVENOUS | Status: DC
  Administered 2018-12-25 – 2018-12-26 (×2): 2 g via INTRAVENOUS
  Filled 2018-12-25 (×3): qty 20

## 2018-12-25 MED ORDER — PANTOPRAZOLE SODIUM 40 MG PO TBEC
40.0000 mg | DELAYED_RELEASE_TABLET | Freq: Every day | ORAL | Status: DC
  Administered 2018-12-25 – 2018-12-26 (×2): 40 mg via ORAL
  Filled 2018-12-25 (×2): qty 1

## 2018-12-25 MED ORDER — CARBAMIDE PEROXIDE 6.5 % OT SOLN: 5.0000 [drp] | Freq: Two times a day (BID) | OTIC | Status: DC

## 2018-12-25 MED ORDER — IOHEXOL 300 MG/ML  SOLN
100.0000 mL | Freq: Once | INTRAMUSCULAR | Status: AC | PRN
  Administered 2018-12-25: 100 mL via INTRAVENOUS

## 2018-12-25 MED ORDER — VITAMIN B-1 100 MG PO TABS
100.0000 mg | ORAL_TABLET | Freq: Every day | ORAL | Status: DC
  Administered 2018-12-25 – 2018-12-26 (×2): 100 mg via ORAL
  Filled 2018-12-25 (×2): qty 1

## 2018-12-25 MED ORDER — LORAZEPAM 2 MG PO TABS: 0.0000 mg | ORAL_TABLET | Freq: Two times a day (BID) | ORAL | Status: DC

## 2018-12-25 MED ORDER — FOLIC ACID 1 MG PO TABS
1.0000 mg | ORAL_TABLET | Freq: Every day | ORAL | Status: DC
  Administered 2018-12-25 – 2018-12-26 (×2): 1 mg via ORAL
  Filled 2018-12-25 (×2): qty 1

## 2018-12-25 MED ORDER — MAGNESIUM SULFATE 4 GM/100ML IV SOLN
4.0000 g | Freq: Once | INTRAVENOUS | Status: AC
  Administered 2018-12-25: 4 g via INTRAVENOUS
  Filled 2018-12-25 (×2): qty 100

## 2018-12-25 NOTE — Procedures (Signed)
  Procedure: US paracentesis   EBL:   minimal Complications:  none immediate  See full dictation in [facility] PACS.  D. Aricela Bertagnolli MD Main # [phone] Pager  [phone]    

## 2018-12-25 NOTE — ED Provider Notes (Signed)
[facility] Emergency Department Provider Note   First MD Initiated Contact with Patient 12/25/18 0234     (approximate)  I have reviewed the triage vital signs and the nursing notes.   HISTORY  Chief Complaint Abdominal Pain   HPI Albert Castillo is a 48 y.o. male with below list of previous medical conditions including alcohol abuse presents to the emergency department secondary to 5-day history of generalized abdominal discomfort and "swelling".  Patient states that his current pain score is 9 out of 10.  Patient also admits to nausea and vomiting as well as dark stools.  Patient denies any chest pain no shortness of breath.  Patient denies any dyspnea.  Patient states that he normally drinks heavily however has not had any alcohol in 3 days.  Patient admits to previous shakes with alcohol cessation however denies any seizure       Past Medical History:  Diagnosis Date   Leg cramps    Restless leg syndrome     Patient Active Problem List   Diagnosis Date Noted   Abdominal pain 12/25/2018    History reviewed. No pertinent surgical history.  Prior to Admission medications   Not on File    Allergies Patient has no known allergies.  No family history on file.  Social History Social History   Tobacco Use   Smoking status: Current Every Day Smoker    Types: Cigarettes   Smokeless tobacco: Never Used  Substance Use Topics   Alcohol use: Yes    Comment: drinks 1 pint of hard liquor every day   Drug use: Never    Review of Systems Constitutional: No fever/chills Eyes: No visual changes. ENT: No sore throat. Cardiovascular: Denies chest pain. Respiratory: Denies shortness of breath. Gastrointestinal: Positive for abdominal pain distention nausea and vomiting diarrhea Genitourinary: Negative for dysuria. Musculoskeletal: Negative for neck pain.  Negative for back pain. Integumentary: Negative for rash. Neurological: Negative for  headaches, focal weakness or numbness. Psychiatric:  Positive for alcohol abuse   ____________________________________________   PHYSICAL EXAM:  VITAL SIGNS: ED Triage Vitals  Enc Vitals Group     BP 12/25/18 0005 (!) 149/77     Pulse Rate 12/25/18 0005 (!) 103     Resp 12/25/18 0005 18     Temp 12/25/18 0005 98.7 F (37.1 C)     Temp Source 12/25/18 0005 Oral     SpO2 12/25/18 0005 98 %     Weight 12/24/18 2356 70.3 kg (155 lb)     Height 12/24/18 2356 1.829 m (6')     Head Circumference --      Peak Flow --      Pain Score 12/24/18 2356 10     Pain Loc --      Pain Edu? --      Excl. in GC? --     Constitutional: Alert and oriented.  Eyes: Conjunctivae are normal.  Scleral icterus Head: Atraumatic. Mouth/Throat: Patient is wearing a mask. Neck: No stridor.  No meningeal signs.   Cardiovascular: Normal rate, regular rhythm. Good peripheral circulation. Grossly normal heart sounds. Respiratory: Normal respiratory effort.  No retractions. Gastrointestinal: Right upper quadrant/epigastric tenderness to palpation.  Positive ascites fluid wave. Musculoskeletal: No lower extremity tenderness nor edema. No gross deformities of extremities. Neurologic:  Normal speech and language. No gross focal neurologic deficits are appreciated.  Tremulous  skin:  Skin is warm, dry and intact.  Jaundice Psychiatric: Mood and affect are normal. Speech and  behavior are normal.  ____________________________________________   LABS (all labs ordered are listed, but only abnormal results are displayed)  Labs Reviewed  CBC WITH DIFFERENTIAL/PLATELET - Abnormal; Notable for the following components:      Result Value   RBC 3.27 (*)    Hemoglobin 11.3 (*)    HCT 32.2 (*)    MCH 34.6 (*)    RDW 16.3 (*)    Platelets 66 (*)    All other components within normal limits  COMPREHENSIVE METABOLIC PANEL - Abnormal; Notable for the following components:   Potassium 3.4 (*)    Glucose, Bld 126  (*)    Calcium 8.2 (*)    Albumin 2.5 (*)    AST 133 (*)    ALT 53 (*)    Total Bilirubin 7.8 (*)    All other components within normal limits  URINALYSIS, COMPLETE (UACMP) WITH MICROSCOPIC - Abnormal; Notable for the following components:   Color, Urine ORANGE (*)    APPearance CLEAR (*)    Glucose, UA   (*)    Value: TEST NOT REPORTED DUE TO COLOR INTERFERENCE OF URINE PIGMENT   Hgb urine dipstick   (*)    Value: TEST NOT REPORTED DUE TO COLOR INTERFERENCE OF URINE PIGMENT   Bilirubin Urine   (*)    Value: TEST NOT REPORTED DUE TO COLOR INTERFERENCE OF URINE PIGMENT   Ketones, ur   (*)    Value: TEST NOT REPORTED DUE TO COLOR INTERFERENCE OF URINE PIGMENT   Protein, ur   (*)    Value: TEST NOT REPORTED DUE TO COLOR INTERFERENCE OF URINE PIGMENT   Nitrite   (*)    Value: TEST NOT REPORTED DUE TO COLOR INTERFERENCE OF URINE PIGMENT   Leukocytes,Ua   (*)    Value: TEST NOT REPORTED DUE TO COLOR INTERFERENCE OF URINE PIGMENT   All other components within normal limits  COMPREHENSIVE METABOLIC PANEL - Abnormal; Notable for the following components:   Glucose, Bld 109 (*)    BUN 5 (*)    Calcium 7.8 (*)    Total Protein 6.4 (*)    Albumin 2.2 (*)    AST 107 (*)    ALT 46 (*)    Total Bilirubin 6.8 (*)    All other components within normal limits  MAGNESIUM - Abnormal; Notable for the following components:   Magnesium 1.4 (*)    All other components within normal limits  GI PATHOGEN PANEL BY PCR, STOOL  C DIFFICILE QUICK SCREEN W PCR REFLEX  SARS CORONAVIRUS 2 (TAT 6-24 HRS)  LIPASE, BLOOD  AMMONIA  PROTIME-INR  HIV ANTIBODY (ROUTINE TESTING W REFLEX)  CBC   ____  RADIOLOGY I, [employer] Mykell Rawl, personally viewed and evaluated these images (plain radiographs) as part of my medical decision making, as well as reviewing the written report by the radiologist.  ED MD interpretation: Hepatic cirrhosis moderate volume abdominal pelvic ascites.  Official radiology  report(s): Ct Abdomen Pelvis W Contrast  Result Date: 12/25/2018 CLINICAL DATA:  Acute abdominal pain. Nausea and vomiting. No ascites. Jaundice. EXAM: CT ABDOMEN AND PELVIS WITH CONTRAST TECHNIQUE: Multidetector CT imaging of the abdomen and pelvis was performed using the standard protocol following bolus administration of intravenous contrast. CONTRAST:  128mL OMNIPAQUE IOHEXOL 300 MG/ML  SOLN COMPARISON:  None. FINDINGS: Lower chest: Trace bilateral pleural effusions and adjacent atelectasis. Large paraesophageal varices. Hepatobiliary: Cirrhotic hepatic morphology with nodular contours. Diffusely heterogeneous liver parenchyma. No evidence of focal mass. Prominently  distended gallbladder without calcified gallstone. No biliary dilatation. Larger recannulated umbilical vein. No evidence of portal vein thrombosis. Pancreas: No ductal dilatation or inflammation. Spleen: No splenomegaly, spleen measures 11 cm greatest dimension. No focal splenic abnormality. Adrenals/Urinary Tract: Normal adrenal glands. No hydronephrosis or perinephric edema. Homogeneous renal enhancement with symmetric excretion on delayed phase imaging. Urinary bladder is partially distended without wall thickening. Stomach/Bowel: Large paraesophageal varices. Mild wall thickening about the distal stomach. Small bowel fused diffusely slightly edematous. No obstruction. Normal appendix. There is wall thickening of the cecum and ascending colon. Minimal diverticulosis without diverticulitis. Vascular/Lymphatic: Mild aortic atherosclerosis. No aneurysm. No evidence of portal vein thrombosis. Recannulated umbilical vein. Splenic vein is not well-defined adjacent to the pancreatic tail. No enlarged lymph nodes in the abdomen or pelvis. Reproductive: Prostate is unremarkable. Other: Moderate volume abdominopelvic and mesenteric ascites. Small fat containing umbilical hernia with adjacent edema. No free air. No organized fluid collection.  Musculoskeletal: There are no acute or suspicious osseous abnormalities. Degenerative disc disease at L5-S1. IMPRESSION: 1. Hepatic cirrhosis. Moderate volume abdominopelvic ascites. Portal hypertension with recannulated umbilical vein and large paraesophageal varices. No splenomegaly. 2. Wall thickening of the cecum and ascending colon, likely secondary to portal colopathy. 3. Trace bilateral pleural effusions and adjacent atelectasis. Aortic Atherosclerosis (ICD10-I70.0). Electronically Signed   By: Narda Rutherford M.D.   On: 12/25/2018 04:35    .Critical Care Performed by: Darci Current, MD Authorized by: Darci Current, MD   Critical care provider statement:    Critical care time (minutes):  45   Critical care time was exclusive of:  Separately billable procedures and treating other patients (Alcohol withdrawal)   Critical care was time spent personally by me on the following activities:  Development of treatment plan with patient or surrogate, discussions with consultants, evaluation of patient's response to treatment, examination of patient, obtaining history from patient or surrogate, ordering and performing treatments and interventions, ordering and review of laboratory studies, ordering and review of radiographic studies, pulse oximetry, re-evaluation of patient's condition and review of old charts     ____________________________________________   INITIAL IMPRESSION / MDM / ASSESSMENT AND PLAN / ED COURSE  As part of my medical decision making, I reviewed the following data within the electronic MEDICAL RECORD NUMBER   48 year old male presented with above-stated history and physical exam concerning for cirrhosis and ascites concern for possible liver failure given thrombocytopenia with a platelet count of 66, elevated liver enzymes including bilirubin of 7.8.  Also concern for possible alcohol withdrawal given tachycardia tremulousness and cessation of alcohol use 3 days ago.   CIWA protocol initiated.  Patient discussed with Dr. Arville Care for hospital admission for further evaluation and management.  ____________________________________________  FINAL CLINICAL IMPRESSION(S) / ED DIAGNOSES  Final diagnoses:  Alcohol withdrawal syndrome with complication (HCC)  Alcoholic cirrhosis of liver with ascites (HCC)     MEDICATIONS GIVEN DURING THIS VISIT:  Medications  LORazepam (ATIVAN) injection 0-4 mg (2 mg Intravenous Given 12/25/18 0354)    Or  LORazepam (ATIVAN) tablet 0-4 mg ( Oral See Alternative 12/25/18 0354)  LORazepam (ATIVAN) injection 0-4 mg (has no administration in time range)    Or  LORazepam (ATIVAN) tablet 0-4 mg (has no administration in time range)  thiamine (VITAMIN B-1) tablet 100 mg (has no administration in time range)    Or  thiamine (B-1) injection 100 mg (has no administration in time range)  0.9 % NaCl with KCl 20 mEq/ L  infusion ( Intravenous  New Bag/Given 12/25/18 0445)  pantoprazole (PROTONIX) injection 40 mg (has no administration in time range)  cefTRIAXone (ROCEPHIN) 2 g in sodium chloride 0.9 % 100 mL IVPB (has no administration in time range)  sodium chloride 0.9 % 1,000 mL with thiamine 100 mg, folic acid 1 mg, multivitamins adult 10 mL infusion (has no administration in time range)  LORazepam (ATIVAN) injection 1 mg (has no administration in time range)  sodium chloride 0.9 % bolus 1,000 mL (0 mLs Intravenous Stopped 12/25/18 0537)  ondansetron (ZOFRAN) injection 4 mg (4 mg Intravenous Given 12/25/18 0321)  morphine 2 MG/ML injection 2 mg (2 mg Intravenous Given 12/25/18 0320)  iohexol (OMNIPAQUE) 300 MG/ML solution 100 mL (100 mLs Intravenous Contrast Given 12/25/18 0356)     ED Discharge Orders    None      *Please note:  Albert GrillsDwayne Castillo was evaluated in Emergency Department on 12/25/2018 for the symptoms described in the history of present illness. He was evaluated in the context of the global COVID-19 pandemic, which  necessitated consideration that the patient might be at risk for infection with the SARS-CoV-2 virus that causes COVID-19. Institutional protocols and algorithms that pertain to the evaluation of patients at risk for COVID-19 are in a state of rapid change based on information released by regulatory bodies including the CDC and federal and state organizations. These policies and algorithms were followed during the patient's care in the ED.  Some ED evaluations and interventions may be delayed as a result of limited staffing during the pandemic.*  Note:  This document was prepared using Dragon voice recognition software and may include unintentional dictation errors.   Darci CurrentBrown, [place] N, MD 12/25/18 [phone]

## 2018-12-25 NOTE — Progress Notes (Signed)
The patient wanted to leave AMA.  Vitals and lab reviewed. I discussed with the patient about his current condition and the treatment plan.  The patient agreed to stay in the hospital for Further treatment.  Discussed with the patient and RN.

## 2018-12-25 NOTE — H&P (Addendum)
[facility] NAME: Albert Castillo    MR#:  [id number]  DATE OF BIRTH:  Jul 04, 1970  DATE OF ADMISSION:  12/25/2018  PRIMARY CARE PHYSICIAN: Patient, No Pcp Per   REQUESTING/REFERRING PHYSICIAN: Marjean Donna, MD  CHIEF COMPLAINT:   Chief Complaint  Patient presents with  . Abdominal Pain    HISTORY OF PRESENT ILLNESS:  Albert Castillo  is a 48 y.o. male with a known history of restless leg syndrome and alcohol abuse, presented to emergency room with onset of generalized abdominal pain and distention with a feeling of tightness with associated nausea and vomiting as well as diarrhea with very frequent bowel movements over the last couple of days.  He denied any bilious vomitus or hematemesis.  He noticed his stools are dark with no blood.  No dyspnea or chest pain or palpitations.  No cough or wheezing or hemoptysis.  He admitted to mild chills but did not have any measured fever.  Upon presentation to the emergency room vital signs revealed a blood pressure of 149/77 with a pulse of 103 with otherwise normal vital signs..  Labs revealed hypokalemia of 3.4 with normal serum lipase of 24 elevated AST 133, ALT 53 to obtain 7.4 with total bilirubin of 7.8.  CBC showed anemia with hemoglobin of 11.2 hematocrit 32.2 with thrombocytopenia with platelets of 66.  The patient was given 2 mg of IV morphine sulfate and 4 mg of IV Zofran, 1 L bolus of IV normal saline as well as thiamine 100 mg IV.  He will be admitted to a medical monitored bed for further evaluation and management. PAST MEDICAL HISTORY:   Past Medical History:  Diagnosis Date  . Leg cramps   . Restless leg syndrome   Tobacco and alcohol abuse  PAST SURGICAL HISTORY:  History reviewed. No pertinent surgical history.  SOCIAL HISTORY:   Social History   Tobacco Use  . Smoking status: Current Every Day Smoker    Types: Cigarettes  . Smokeless tobacco: Never Used   Substance Use Topics  . Alcohol use: Yes    Comment: drinks 1 pint of hard liquor every day    FAMILY HISTORY:  Positive for Alzheimer's dementia.  His mother had scoliosis  DRUG ALLERGIES:  No Known Allergies  REVIEW OF SYSTEMS:   ROS As per history of present illness. All pertinent systems were reviewed above. Constitutional,  HEENT, cardiovascular, respiratory, GI, GU, musculoskeletal, neuro, psychiatric, endocrine,  integumentary and hematologic systems were reviewed and are otherwise  negative/unremarkable except for positive findings mentioned above in the HPI.   MEDICATIONS AT HOME:   Prior to Admission medications   Not on File      VITAL SIGNS:  Blood pressure 137/79, pulse 89, temperature 98.7 F (37.1 C), temperature source Oral, resp. rate 18, height 6' (1.829 m), weight 70.3 kg, SpO2 96 %.  PHYSICAL EXAMINATION:  Physical Exam  GENERAL:  48 y.o.-year-old Caucasian male patient lying in the bed with no acute distress.  EYES: Pupils equal, round, reactive to light and accommodation.  Positive scleral icterus. Extraocular muscles intact.  HEENT: Head atraumatic, normocephalic. Oropharynx and nasopharynx clear.  NECK:  Supple, no jugular venous distention. No thyroid enlargement, no tenderness.  LUNGS: Normal breath sounds bilaterally, no wheezing, rales,rhonchi or crepitation. No use of accessory muscles of respiration.  CARDIOVASCULAR: Regular rate and rhythm, S1, S2 normal. No murmurs, rubs, or gallops.  ABDOMEN: Soft, distended with generalized tenderness without rebound  tenderness guarding or rigidity.  He had positive shifting dullness.  Bowel sounds present. No organomegaly or mass due to significant ascites.  EXTREMITIES: 1+ bilateral lower extremity pitting edema with no cyanosis, or clubbing.  NEUROLOGIC: Cranial nerves II through XII are intact. Muscle strength 5/5 in all extremities. Sensation intact. Gait not checked.  PSYCHIATRIC: The patient is  alert and oriented x 3.  Normal affect and good eye contact. SKIN: No obvious rash, lesion, or ulcer.   LABORATORY PANEL:   CBC Recent Labs  Lab 12/25/18 0008  WBC 6.9  HGB 11.3*  HCT 32.2*  PLT 66*   ------------------------------------------------------------------------------------------------------------------  Chemistries  Recent Labs  Lab 12/25/18 0008  NA 135  K 3.4*  CL 100  CO2 26  GLUCOSE 126*  BUN 6  CREATININE 0.73  CALCIUM 8.2*  AST 133*  ALT 53*  ALKPHOS 99  BILITOT 7.8*   ------------------------------------------------------------------------------------------------------------------  Cardiac Enzymes No results for input(s): TROPONINI in the last 168 hours. ------------------------------------------------------------------------------------------------------------------  RADIOLOGY:  No results found.    IMPRESSION AND PLAN:   1.  Abdominal pain likely secondary to symptomatic large ascites. -The patient will be admitted to a medical monitored bed. -We will obtain diagnostic and therapeutic ultrasound-guided paracentesis. -Peritoneal fluid labs were ordered. -Pain management will be provided. -We will start him on empiric IV Rocephin pending ruling out spontaneous bacterial peritonitis. -His diuretic therapy with Lasix and Aldactone can be resumed after paracentesis.    2.  Acute gastroenteritis.  This could be contributing to abdominal pain. -He has stool C. difficile and pathogen ordered. -He will be gently hydrated with IV normal saline with added potassium chloride. -PPI therapy will be provided.  3.  Hypokalemia. -Potassium will be replaced and magnesium level will be checked.  4.  Alcohol abuse. -He will be placed on as needed IV Ativan for alcohol withdrawal. -Banana bag will be provided daily.  5.  Tobacco abuse. -I counseled the patient for smoking cessation and the patient will receive further counseling here.  6.  DVT  prophylaxis. -SCDs for now.  Medical prophylaxis is currently contraindicated due to thrombocytopenia likely due to liver cell failure..   All the records are reviewed and case discussed with ED provider. The plan of care was discussed in details with the patient (and family). I answered all questions. The patient agreed to proceed with the above mentioned plan. Further management will depend upon hospital course.   CODE STATUS: Full code  TOTAL TIME TAKING CARE OF THIS PATIENT: 55 minutes.    Hannah Beat M.D on 12/25/2018 at 4:17 AM  Pager - [phone]  After 6pm go to [email or web address] - Social research officer, government  [facility]  Office  [phone]  CC: Primary care physician; Patient, No Pcp Per   Note: This dictation was prepared with Dragon dictation along with smaller phrase technology. Any transcriptional errors that result from this process are unintentional.

## 2018-12-25 NOTE — Consult Note (Signed)
Albert Lame, MD [facility]  [address] [place], [place] [address] Phone: [phone] Fax : [phone]  Consultation  Referring Provider:     Dr. Bridgett Larsson Primary Care Physician:  Patient, No Pcp Per Primary Gastroenterologist:  Sharion Settler         Reason for Consultation:     Cirrhosis  Date of Admission:  12/25/2018 Date of Consultation:  12/25/2018         HPI:   Albert Castillo is a 48 y.o. male who has a long history of alcohol abuse.  Patient states he drinks approximately a pint of vodka every day.  When he is not drinking vodka he says that he drinks beer.  The patient came in with abdominal distention and was found to have ascites.  The patient also noted that he had some dark stools but not black and cannot see any blood.  The patient's hemoglobin on admission was 11.3 and was 10.2 this morning his baseline year ago was 12.2.  The patient reports that he has had less pain since having the fluid taken off his abdomen.  The patient's creatinine is 1 was 0.69 with BUN of 5.  The patient's INR was also elevated at 2.2.  The patient enzyme showed his AST to be 133 with ALT of 53 consistent with alcoholic hepatitis.  The patient has been put on alcohol withdrawal protocols.  Past Medical History:  Diagnosis Date  . Anxiety   . Depression   . Leg cramps   . Restless leg syndrome     History reviewed. No pertinent surgical history.  Prior to Admission medications   Not on File    No family history on file.   Social History   Tobacco Use  . Smoking status: Current Every Day Smoker    Types: Cigarettes  . Smokeless tobacco: Never Used  Substance Use Topics  . Alcohol use: Yes    Comment: drinks 1 pint of hard liquor every day  . Drug use: Never    Allergies as of 12/24/2018  . (No Known Allergies)    Review of Systems:    All systems reviewed and negative except where noted in HPI.   Physical Exam:  Vital signs in last 24 hours: Temp:  [98.2 F (36.8  C)-98.7 F (37.1 C)] 98.2 F (36.8 C) (10/22 2043) Pulse Rate:  [74-103] 89 (10/22 2043) Resp:  [18-20] 20 (10/22 2043) BP: (123-149)/(72-87) 125/78 (10/22 2043) SpO2:  [96 %-100 %] 100 % (10/22 2043) Weight:  [70.3 kg-81.8 kg] 81.8 kg (10/22 0605) Last BM Date: 12/25/18 General:   Pleasant, cooperative in NAD Head:  Normocephalic and atraumatic. Eyes:   No icterus.   Conjunctiva pink. PERRLA. Ears:  Normal auditory acuity. Neck:  Supple; no masses or thyroidomegaly Lungs: Respirations even and unlabored. Lungs clear to auscultation bilaterally.   No wheezes, crackles, or rhonchi.  Heart:  Regular rate and rhythm;  Without murmur, clicks, rubs or gallops Abdomen:  Soft, nondistended, nontender. Normal bowel sounds. No appreciable masses or hepatomegaly.  No rebound or guarding.  Rectal:  Not performed. Msk:  Symmetrical without gross deformities.   Extremities:  Without edema, cyanosis or clubbing. Neurologic:  Alert and oriented x3;  grossly normal neurologically. Skin:  Intact without significant lesions or rashes. Cervical Nodes:  No significant cervical adenopathy. Psych:  Alert and cooperative. Normal affect.  LAB RESULTS: Recent Labs    12/25/18 0008 12/25/18 0446  WBC 6.9 6.1  HGB 11.3* 10.2*  HCT 32.2* 29.5*  PLT 66* 57*   BMET Recent Labs    12/25/18 0008 12/25/18 0446  NA 135 136  K 3.4* 3.8  CL 100 101  CO2 26 26  GLUCOSE 126* 109*  BUN 6 5*  CREATININE 0.73 0.69  CALCIUM 8.2* 7.8*   LFT Recent Labs    12/25/18 0446  PROT 6.4*  ALBUMIN 2.2*  AST 107*  ALT 46*  ALKPHOS 85  BILITOT 6.8*   PT/INR Recent Labs    12/25/18 0446  LABPROT 24.4*  INR 2.2*    STUDIES: Ct Abdomen Pelvis W Contrast  Result Date: 12/25/2018 CLINICAL DATA:  Acute abdominal pain. Nausea and vomiting. No ascites. Jaundice. EXAM: CT ABDOMEN AND PELVIS WITH CONTRAST TECHNIQUE: Multidetector CT imaging of the abdomen and pelvis was performed using the standard protocol  following bolus administration of intravenous contrast. CONTRAST:  100mL OMNIPAQUE IOHEXOL 300 MG/ML  SOLN COMPARISON:  None. FINDINGS: Lower chest: Trace bilateral pleural effusions and adjacent atelectasis. Large paraesophageal varices. Hepatobiliary: Cirrhotic hepatic morphology with nodular contours. Diffusely heterogeneous liver parenchyma. No evidence of focal mass. Prominently distended gallbladder without calcified gallstone. No biliary dilatation. Larger recannulated umbilical vein. No evidence of portal vein thrombosis. Pancreas: No ductal dilatation or inflammation. Spleen: No splenomegaly, spleen measures 11 cm greatest dimension. No focal splenic abnormality. Adrenals/Urinary Tract: Normal adrenal glands. No hydronephrosis or perinephric edema. Homogeneous renal enhancement with symmetric excretion on delayed phase imaging. Urinary bladder is partially distended without wall thickening. Stomach/Bowel: Large paraesophageal varices. Mild wall thickening about the distal stomach. Small bowel fused diffusely slightly edematous. No obstruction. Normal appendix. There is wall thickening of the cecum and ascending colon. Minimal diverticulosis without diverticulitis. Vascular/Lymphatic: Mild aortic atherosclerosis. No aneurysm. No evidence of portal vein thrombosis. Recannulated umbilical vein. Splenic vein is not well-defined adjacent to the pancreatic tail. No enlarged lymph nodes in the abdomen or pelvis. Reproductive: Prostate is unremarkable. Other: Moderate volume abdominopelvic and mesenteric ascites. Small fat containing umbilical hernia with adjacent edema. No free air. No organized fluid collection. Musculoskeletal: There are no acute or suspicious osseous abnormalities. Degenerative disc disease at L5-S1. IMPRESSION: 1. Hepatic cirrhosis. Moderate volume abdominopelvic ascites. Portal hypertension with recannulated umbilical vein and large paraesophageal varices. No splenomegaly. 2. 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 3. Trace bilateral pleural effusions and adjacent atelectasis. Aortic Atherosclerosis (ICD10-I70.0). Electronically Signed   By: Narda RutherfordMelanie  Sanford M.D.   On: 12/25/2018 04:35   [place] Paracentesis  Result Date: 12/25/2018 INDICATION: Abdominal pain, ascites, jaundice EXAM: ULTRASOUND GUIDED  PARACENTESIS MEDICATIONS: Lidocaine 1% subcutaneous COMPLICATIONS: None immediate. PROCEDURE: Informed written consent was obtained from the patient after a discussion of the risks, benefits and alternatives to treatment. A timeout was performed prior to the initiation of the procedure. Initial ultrasound scanning demonstrates a moderate amount of ascites within the abdomen. The right lateral abdomen was prepped and draped in the usual sterile fashion. 1% lidocaine was used for local anesthesia. Following this, a Safe-T-Centesis catheter was introduced. An ultrasound image was saved for documentation purposes. The paracentesis was performed. The catheter was removed and a dressing was applied. The patient tolerated the procedure well without immediate post procedural complication. FINDINGS: A total of approximately 2.2 L of clear straw-colored fluid was removed. Samples were sent to the laboratory as requested by the clinical team. IMPRESSION: Successful ultrasound-guided paracentesis yielding 2.2 liters of peritoneal fluid. Electronically Signed   By: Corlis Leak  Hassell M.D.   On: 12/25/2018  12:19      Impression / Plan:   Assessment: Active Problems:   Abdominal pain   Albert Castillo is a 48 y.o. y/o male with admission for ascites and abdominal distention.  Patient had a paracentesis that did not show any sign of SBP.  The patient has a long history of alcohol abuse.  Despite having dark stools he has a normal BUN and no sign of any GI bleeding.  Plan:  The patient has ascites and cirrhosis.  The patient also has signs of alcoholic hepatitis.   Patient has been told that he needs to stop drinking.  Patient has been started on Lasix and Aldactone.  There is nothing to do acutely for this patient and his absence of alcohol will be most beneficial for him.  The patient has been given my card and follow-up as an outpatient.  I will sign off.  Please call if any further GI concerns or questions.  We would like to thank you for the opportunity to participate in the care of Arnol Dunckel.    Thank you for involving me in the care of this patient.      LOS: 0 days   Midge Minium, MD  12/25/2018, 9:42 PM Pager [phone] 7am-5pm  Check AMION for 5pm -7am coverage and on weekends   Note: This dictation was prepared with Dragon dictation along with smaller phrase technology. Any transcriptional errors that result from this process are unintentional.

## 2018-12-25 NOTE — ED Notes (Signed)
ED TO INPATIENT HANDOFF REPORT  ED Nurse Name and Phone #: Cala Bradford [phone]  S Name/Age/Gender Albert Castillo 48 y.o. male Room/Bed: ED19A/ED19A  Code Status   Code Status: Full Code  Home/SNF/Other Home Patient oriented to: self, place, time and situation Is this baseline? Yes   Triage Complete: Triage complete  Chief Complaint Abd Pain and swelling  Triage Note Patient ambulatory to triage with steady gait, without difficulty or distress noted, mask in place; pt st x 5 days having abd pain/swelling accomp by N/V/D   Allergies No Known Allergies  Level of Care/Admitting Diagnosis ED Disposition    ED Disposition Condition Comment   Admit  Hospital Area: [facility] [[id number]]  Level of Care: Med-Surg [16]  Covid Evaluation: Asymptomatic Screening Protocol (No Symptoms)  Diagnosis: Abdominal pain [818299]  Admitting Physician: Hannah Beat [[id number]]  Attending Physician: Hannah Beat [[id number]]  Estimated length of stay: past midnight tomorrow  Certification:: I certify this patient will need inpatient services for at least 2 midnights  PT Class (Do Not Modify): Inpatient [101]  PT Acc Code (Do Not Modify): Private [1]       B Medical/Surgery History Past Medical History:  Diagnosis Date  . Leg cramps   . Restless leg syndrome    History reviewed. No pertinent surgical history.   A IV Location/Drains/Wounds Patient Lines/Drains/Airways Status   Active Line/Drains/Airways    Name:   Placement date:   Placement time:   Site:   Days:   Peripheral IV 12/25/18 Right Antecubital   12/25/18    0310    Antecubital   less than 1          Intake/Output Last 24 hours No intake or output data in the 24 hours ending 12/25/18 0517  Labs/Imaging Results for orders placed or performed during the hospital encounter of 12/25/18 (from the past 48 hour(s))  CBC with Differential     Status: Abnormal   Collection Time: 12/25/18 12:08 AM  Result  Value Ref Range   WBC 6.9 4.0 - 10.5 K/uL   RBC 3.27 (L) 4.22 - 5.81 MIL/uL   Hemoglobin 11.3 (L) 13.0 - 17.0 g/dL   HCT 75.1 (L) 02.5 - 85.2 %   MCV 98.5 80.0 - 100.0 fL   MCH 34.6 (H) 26.0 - 34.0 pg   MCHC 35.1 30.0 - 36.0 g/dL   RDW 77.8 (H) 24.2 - 35.3 %   Platelets 66 (L) 150 - 400 K/uL    Comment: Immature Platelet Fraction may be clinically indicated, consider ordering this additional test [id number]    nRBC 0.0 0.0 - 0.2 %   Neutrophils Relative % 63 %   Neutro Abs 4.4 1.7 - 7.7 K/uL   Lymphocytes Relative 25 %   Lymphs Abs 1.7 0.7 - 4.0 K/uL   Monocytes Relative 10 %   Monocytes Absolute 0.7 0.1 - 1.0 K/uL   Eosinophils Relative 1 %   Eosinophils Absolute 0.1 0.0 - 0.5 K/uL   Basophils Relative 1 %   Basophils Absolute 0.1 0.0 - 0.1 K/uL   WBC Morphology MORPHOLOGY UNREMARKABLE    Smear Review PLATELETS APPEAR DECREASED    Immature Granulocytes 0 %   Abs Immature Granulocytes 0.02 0.00 - 0.07 K/uL   Polychromasia PRESENT    Target Cells PRESENT     Comment: Performed at [facility], [address], [place], [place] [address]  Comprehensive metabolic panel     Status: Abnormal   Collection Time:  12/25/18 12:08 AM  Result Value Ref Range   Sodium 135 135 - 145 mmol/L   Potassium 3.4 (L) 3.5 - 5.1 mmol/L   Chloride 100 98 - 111 mmol/L   CO2 26 22 - 32 mmol/L   Glucose, Bld 126 (H) 70 - 99 mg/dL   BUN 6 6 - 20 mg/dL   Creatinine, Ser 0.73 0.61 - 1.24 mg/dL   Calcium 8.2 (L) 8.9 - 10.3 mg/dL   Total Protein 7.4 6.5 - 8.1 g/dL   Albumin 2.5 (L) 3.5 - 5.0 g/dL   AST 133 (H) 15 - 41 U/L   ALT 53 (H) 0 - 44 U/L   Alkaline Phosphatase 99 38 - 126 U/L   Total Bilirubin 7.8 (H) 0.3 - 1.2 mg/dL   GFR calc non Af Amer >60 >60 mL/min   GFR calc Af Amer >60 >60 mL/min   Anion gap 9 5 - 15    Comment: Performed at [facility], [address] [place], [place] [address]  Lipase, blood     Status: None   Collection Time: 12/25/18 12:08 AM  Result  Value Ref Range   Lipase 24 11 - 51 U/L    Comment: Performed at [facility], [address] [place], [place] [address]  Urinalysis, Complete w Microscopic     Status: Abnormal   Collection Time: 12/25/18 12:08 AM  Result Value Ref Range   Color, Urine ORANGE (A) YELLOW   APPearance CLEAR (A) CLEAR   Specific Gravity, Urine 1.027 1.005 - 1.030   pH  5.0 - 8.0    TEST NOT REPORTED DUE TO COLOR INTERFERENCE OF URINE PIGMENT   Glucose, UA (A) NEGATIVE mg/dL    TEST NOT REPORTED DUE TO COLOR INTERFERENCE OF URINE PIGMENT   Hgb urine dipstick (A) NEGATIVE    TEST NOT REPORTED DUE TO COLOR INTERFERENCE OF URINE PIGMENT   Bilirubin Urine (A) NEGATIVE    TEST NOT REPORTED DUE TO COLOR INTERFERENCE OF URINE PIGMENT   Ketones, ur (A) NEGATIVE mg/dL    TEST NOT REPORTED DUE TO COLOR INTERFERENCE OF URINE PIGMENT   Protein, ur (A) NEGATIVE mg/dL    TEST NOT REPORTED DUE TO COLOR INTERFERENCE OF URINE PIGMENT   Nitrite (A) NEGATIVE    TEST NOT REPORTED DUE TO COLOR INTERFERENCE OF URINE PIGMENT   Leukocytes,Ua (A) NEGATIVE    TEST NOT REPORTED DUE TO COLOR INTERFERENCE OF URINE PIGMENT   WBC, UA 0-5 0 - 5 WBC/hpf   Bacteria, UA NONE SEEN NONE SEEN   Squamous Epithelial / LPF NONE SEEN 0 - 5   Mucus PRESENT     Comment: Performed at [facility], [address], [place], [place] [address]   Ct Abdomen Pelvis W Contrast  Result Date: 12/25/2018 CLINICAL DATA:  Acute abdominal pain. Nausea and vomiting. No ascites. Jaundice. EXAM: CT ABDOMEN AND PELVIS WITH CONTRAST TECHNIQUE: Multidetector CT imaging of the abdomen and pelvis was performed using the standard protocol following bolus administration of intravenous contrast. CONTRAST:  135mL OMNIPAQUE IOHEXOL 300 MG/ML  SOLN COMPARISON:  None. FINDINGS: Lower chest: Trace bilateral pleural effusions and adjacent atelectasis. Large paraesophageal varices. Hepatobiliary: Cirrhotic hepatic morphology with nodular contours.  Diffusely heterogeneous liver parenchyma. No evidence of focal mass. Prominently distended gallbladder without calcified gallstone. No biliary dilatation. Larger recannulated umbilical vein. No evidence of portal vein thrombosis. Pancreas: No ductal dilatation or inflammation. Spleen: No splenomegaly, spleen measures 11 cm greatest dimension. No focal splenic  abnormality. Adrenals/Urinary Tract: Normal adrenal glands. No hydronephrosis or perinephric edema. Homogeneous renal enhancement with symmetric excretion on delayed phase imaging. Urinary bladder is partially distended without wall thickening. Stomach/Bowel: Large paraesophageal varices. Mild wall thickening about the distal stomach. Small bowel fused diffusely slightly edematous. No obstruction. Normal appendix. There is wall thickening of the cecum and ascending colon. Minimal diverticulosis without diverticulitis. Vascular/Lymphatic: Mild aortic atherosclerosis. No aneurysm. No evidence of portal vein thrombosis. Recannulated umbilical vein. Splenic vein is not well-defined adjacent to the pancreatic tail. No enlarged lymph nodes in the abdomen or pelvis. Reproductive: Prostate is unremarkable. Other: Moderate volume abdominopelvic and mesenteric ascites. Small fat containing umbilical hernia with adjacent edema. No free air. No organized fluid collection. Musculoskeletal: There are no acute or suspicious osseous abnormalities. Degenerative disc disease at L5-S1. IMPRESSION: 1. Hepatic cirrhosis. Moderate volume abdominopelvic ascites. Portal hypertension with recannulated umbilical vein and large paraesophageal varices. No splenomegaly. 2. Wall thickening of the cecum and ascending colon, likely secondary to portal colopathy. 3. Trace bilateral pleural effusions and adjacent atelectasis. Aortic Atherosclerosis (ICD10-I70.0). Electronically Signed   By: Narda RutherfordMelanie  Sanford M.D.   On: 12/25/2018 04:35    Pending Labs Unresulted Labs (From admission, onward)     Start     Ordered   12/25/18 0500  Comprehensive metabolic panel  Tomorrow morning,   STAT     12/25/18 0416   12/25/18 0500  CBC  Tomorrow morning,   STAT     12/25/18 0416   12/25/18 0417  Magnesium  Add-on,   AD    Comments: For level less than 2, give 2 g of IV magnesium sulfate    12/25/18 0416   12/25/18 0413  HIV Antibody (routine testing w rflx)  (HIV Antibody (Routine testing w reflex) panel)  Once,   STAT     12/25/18 0416   12/25/18 0408  Protime-INR  ONCE - STAT,   STAT     12/25/18 0407   12/25/18 0320  SARS CORONAVIRUS 2 (TAT 6-24 HRS) Nasopharyngeal Nasopharyngeal Swab  (Asymptomatic/Tier 2 Patients Labs)  Once,   STAT    Question Answer Comment  Is this test for diagnosis or screening Screening   Symptomatic for COVID-19 as defined by CDC No   Hospitalized for COVID-19 No   Admitted to ICU for COVID-19 No   Previously tested for COVID-19 No   Resident in a congregate (group) care setting No   Employed in healthcare setting No      12/25/18 0319   12/25/18 0247  Ammonia  ONCE - STAT,   STAT     12/25/18 0248   12/25/18 0245  GI pathogen panel by PCR, stool  (Gastrointestinal Panel by PCR, Stool                                                                                                                                                     *  Does Not include CLOSTRIDIUM DIFFICILE testing.**If CDIFF testing is needed, select the C Difficile Quick Screen w PCR reflex order below)  ONCE - STAT,   STAT     12/25/18 0244   12/25/18 0245  C difficile quick scan w PCR reflex  (C Difficile quick screen w PCR reflex panel)  Once, for 24 hours,   STAT     12/25/18 0245          Vitals/Pain Today's Vitals   12/24/18 2356 12/25/18 0005 12/25/18 0330  BP:  (!) 149/77 137/79  Pulse:  (!) 103 89  Resp:  18   Temp:  98.7 F (37.1 C)   TempSrc:  Oral   SpO2:  98% 96%  Weight: 70.3 kg    Height: 6' (1.829 m)    PainSc: 10-Worst pain ever      Isolation  Precautions Enteric precautions (UV disinfection)  Medications Medications  LORazepam (ATIVAN) injection 0-4 mg (2 mg Intravenous Given 12/25/18 0354)    Or  LORazepam (ATIVAN) tablet 0-4 mg ( Oral See Alternative 12/25/18 0354)  LORazepam (ATIVAN) injection 0-4 mg (has no administration in time range)    Or  LORazepam (ATIVAN) tablet 0-4 mg (has no administration in time range)  thiamine (VITAMIN B-1) tablet 100 mg (has no administration in time range)    Or  thiamine (B-1) injection 100 mg (has no administration in time range)  0.9 % NaCl with KCl 20 mEq/ L  infusion ( Intravenous New Bag/Given 12/25/18 0445)  pantoprazole (PROTONIX) injection 40 mg (has no administration in time range)  cefTRIAXone (ROCEPHIN) 2 g in sodium chloride 0.9 % 100 mL IVPB (has no administration in time range)  LORazepam (ATIVAN) bolus via infusion 1 mg (has no administration in time range)  sodium chloride 0.9 % 1,000 mL with thiamine 100 mg, folic acid 1 mg, multivitamins adult 10 mL infusion (has no administration in time range)  sodium chloride 0.9 % bolus 1,000 mL (1,000 mLs Intravenous New Bag/Given 12/25/18 0320)  ondansetron (ZOFRAN) injection 4 mg (4 mg Intravenous Given 12/25/18 0321)  morphine 2 MG/ML injection 2 mg (2 mg Intravenous Given 12/25/18 0320)  iohexol (OMNIPAQUE) 300 MG/ML solution 100 mL (100 mLs Intravenous Contrast Given 12/25/18 0356)    Mobility walks Low fall risk   Focused Assessments CIWA   R Recommendations: See Admitting Provider Note  Report given to:   Additional Notes:

## 2018-12-26 LAB — ACID FAST SMEAR (AFB, MYCOBACTERIA): Acid Fast Smear: NEGATIVE

## 2018-12-26 LAB — MAGNESIUM: Magnesium: 1.7 mg/dL (ref 1.7–2.4)

## 2018-12-26 LAB — PROTEIN, BODY FLUID (OTHER): Total Protein, Body Fluid Other: 1.1 g/dL

## 2018-12-26 MED ORDER — PANTOPRAZOLE SODIUM 40 MG PO TBEC: 40.0000 mg | DELAYED_RELEASE_TABLET | Freq: Every day | ORAL | 0 refills | Status: AC

## 2018-12-26 MED ORDER — ADULT MULTIVITAMIN W/MINERALS CH: 1.0000 | ORAL_TABLET | Freq: Every day | ORAL | 1 refills | Status: AC

## 2018-12-26 MED ORDER — NICOTINE 14 MG/24HR TD PT24: 14.0000 mg | MEDICATED_PATCH | Freq: Every day | TRANSDERMAL | 0 refills | Status: AC

## 2018-12-26 MED ORDER — SPIRONOLACTONE 25 MG PO TABS: 25.0000 mg | ORAL_TABLET | Freq: Every day | ORAL | 1 refills | Status: DC

## 2018-12-26 MED ORDER — THIAMINE HCL 100 MG PO TABS: 100.0000 mg | ORAL_TABLET | Freq: Every day | ORAL | 0 refills | Status: AC

## 2018-12-26 MED ORDER — FUROSEMIDE 40 MG PO TABS: 40.0000 mg | ORAL_TABLET | Freq: Every day | ORAL | 1 refills | Status: DC

## 2018-12-26 MED ORDER — MAGNESIUM SULFATE 2 GM/50ML IV SOLN
2.0000 g | Freq: Once | INTRAVENOUS | Status: AC
  Administered 2018-12-26: 2 g via INTRAVENOUS
  Filled 2018-12-26: qty 50

## 2018-12-26 MED ORDER — FOLIC ACID 1 MG PO TABS: 1.0000 mg | ORAL_TABLET | Freq: Every day | ORAL | 0 refills | Status: AC

## 2018-12-26 NOTE — Progress Notes (Signed)
Discharge orders written.  Dr. Bridgett Larsson instructs not to discharge him until he has  PT evaluation.

## 2018-12-26 NOTE — Progress Notes (Addendum)
Patient stumbled negotiating around the negative pressure fan. He caught himself on the window sill and scrapped his arm.  He did not fall.  Cleansed and covered with a bandaid.

## 2018-12-26 NOTE — Discharge Summary (Signed)
[facility] [place] [facility]   PATIENT NAME: Albert GrillsDwayne Castillo    MR#:  [id number]  DATE OF BIRTH:  [birth year]  DATE OF ADMISSION:  12/25/2018   ADMITTING PHYSICIAN: Hannah BeatJan A Mansy, MD  DATE OF DISCHARGE: 12/26/2018 PRIMARY CARE PHYSICIAN: Patient, No Pcp Per   ADMISSION DIAGNOSIS:  Impacted cerumen of right ear [H61.21] Alcoholic cirrhosis of liver with ascites (HCC) [K70.31] Ascites [R18.8] Alcohol withdrawal syndrome with complication (HCC) [F10.239] DISCHARGE DIAGNOSIS:  Active Problems:   Abdominal pain   Abdominal distention   Alcoholic cirrhosis of liver with ascites (HCC)  SECONDARY DIAGNOSIS:   Past Medical History:  Diagnosis Date  . Anxiety   . Depression   . Leg cramps   . Restless leg syndrome    HOSPITAL COURSE:  1.  Abdominal pain likely secondary to symptomatic large ascites. The patient is treated with empiric IV Rocephin. He got paracentesis with 2.2 L fluid withdrawal.  No evidence of SBP.  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 Started Lasix and Aldactone. Follow-up as outpatient per Dr. Servando SnareWohl.  2.  Acute gastroenteritis.  This could be contributing to abdominal pain. Improved.  Started Protonix.  3.  Hypokalemia. Improved with potassium.  Hypomagnesemia.  Improved with IV magnesium.  4.  Alcohol abuse. The patient is on CIWA protocol.  Continue folic acid, thiamine and multivitamin.  Thrombocytopenia.  Possible due to liver cirrhosis.  Follow-up CBC as outpatient.  5.  Tobacco abuse. Smoking cessation was counseled for 3 to 4 minutes by me, nicotine patch. DISCHARGE CONDITIONS:  Stable, discharged to home today. CONSULTS OBTAINED:   DRUG ALLERGIES:  No Known Allergies DISCHARGE MEDICATIONS:   Allergies as of 12/26/2018   No Known Allergies     Medication List    TAKE these medications   folic acid 1 MG tablet Commonly known as: FOLVITE Take 1 tablet (1  mg total) by mouth daily.   furosemide 40 MG tablet Commonly known as: LASIX Take 1 tablet (40 mg total) by mouth daily.   multivitamin with minerals Tabs tablet Take 1 tablet by mouth daily.   nicotine 14 mg/24hr patch Commonly known as: NICODERM CQ - dosed in mg/24 hours Place 1 patch (14 mg total) onto the skin daily.   pantoprazole 40 MG tablet Commonly known as: PROTONIX Take 1 tablet (40 mg total) by mouth daily.   spironolactone 25 MG tablet Commonly known as: ALDACTONE Take 1 tablet (25 mg total) by mouth daily.   thiamine 100 MG tablet Take 1 tablet (100 mg total) by mouth daily.        DISCHARGE INSTRUCTIONS:  See AVS.  If you experience worsening of your admission symptoms, develop shortness of breath, life threatening emergency, suicidal or homicidal thoughts you must seek medical attention immediately by calling 911 or calling your MD immediately  if symptoms less severe.  You Must read complete instructions/literature along with all the possible adverse reactions/side effects for all the Medicines you take and that have been prescribed to you. Take any new Medicines after you have completely understood and accpet all the possible adverse reactions/side effects.   Please note  You were cared for by a hospitalist during your hospital stay. If you have any questions about your discharge medications or the care you received while you were in the hospital after you are discharged, you can call the unit and asked to speak with the hospitalist on call if the hospitalist that took  care of you is not available. Once you are discharged, your primary care physician will handle any further medical issues. Please note that NO REFILLS for any discharge medications will be authorized once you are discharged, as it is imperative that you return to your primary care physician (or establish a relationship with a primary care physician if you do not have one) for your aftercare needs  so that they can reassess your need for medications and monitor your lab values.    On the day of Discharge:  VITAL SIGNS:  Blood pressure 127/81, pulse 89, temperature 97.9 F (36.6 C), temperature source Oral, resp. rate 20, height 6' (1.829 m), weight 79.9 kg, SpO2 98 %. PHYSICAL EXAMINATION:  GENERAL:  48 y.o.-year-old patient lying in the bed with no acute distress.  EYES: Pupils equal, round, reactive to light and accommodation. No scleral icterus. Extraocular muscles intact.  HEENT: Head atraumatic, normocephalic. NECK:  Supple, no jugular venous distention. No thyroid enlargement, no tenderness.  LUNGS: Normal breath sounds bilaterally, no wheezing, rales,rhonchi or crepitation. No use of accessory muscles of respiration.  CARDIOVASCULAR: S1, S2 normal. No murmurs, rubs, or gallops.  ABDOMEN: Soft, non-tender, non-distended. Bowel sounds present. No organomegaly or mass.  EXTREMITIES: No pedal edema, cyanosis, or clubbing.  NEUROLOGIC: Cranial nerves II through XII are intact. Muscle strength 5/5 in all extremities. Sensation intact. Gait not checked.  PSYCHIATRIC: The patient is alert and oriented x 3.  SKIN: No obvious rash, lesion, or ulcer.  DATA REVIEW:   CBC Recent Labs  Lab 12/25/18 0446  WBC 6.1  HGB 10.2*  HCT 29.5*  PLT 57*    Chemistries  Recent Labs  Lab 12/25/18 0446 12/26/18 0629  NA 136  --   K 3.8  --   CL 101  --   CO2 26  --   GLUCOSE 109*  --   BUN 5*  --   CREATININE 0.69  --   CALCIUM 7.8*  --   MG 1.4* 1.7  AST 107*  --   ALT 46*  --   ALKPHOS 85  --   BILITOT 6.8*  --      Microbiology Results  Results for orders placed or performed during the hospital encounter of 12/25/18  SARS CORONAVIRUS 2 (TAT 6-24 HRS) Nasopharyngeal Nasopharyngeal Swab     Status: None   Collection Time: 12/25/18  4:46 AM   Specimen: Nasopharyngeal Swab  Result Value Ref Range Status   SARS Coronavirus 2 NEGATIVE NEGATIVE Final    Comment: (NOTE)  SARS-CoV-2 target nucleic acids are NOT DETECTED. The SARS-CoV-2 RNA is generally detectable in upper and lower respiratory specimens during the acute phase of infection. Negative results do not preclude SARS-CoV-2 infection, do not rule out co-infections with other pathogens, and should not be used as the sole basis for treatment or other patient management decisions. Negative results must be combined with clinical observations, patient history, and epidemiological information. The expected result is Negative. Fact Sheet for Patients: HairSlick.no Fact Sheet for Healthcare Providers: quierodirigir.com This test is not yet approved or cleared by the [place] FDA and  has been authorized for detection and/or diagnosis of SARS-CoV-2 by FDA under an Emergency Use Authorization (EUA). This EUA will remain  in effect (meaning this test can be used) for the duration of the COVID-19 declaration under Section 56 4(b)(1) of the Act, 21 U.S.C. section 360bbb-3(b)(1), unless the authorization is terminated or revoked sooner. Performed at [facility], [address] [place], [place] [address]   Body fluid culture     Status: None (Preliminary result)   Collection Time: 12/25/18 12:06 PM   Specimen: PATH Cytology Peritoneal fluid  Result Value Ref Range Status   Specimen Description   Final    PERITONEAL Performed at [facility], [address], [place], [place] [address]    Special Requests   Final    NONE Performed at [facility], [address] [place], [place] [address]    Gram Stain   Final    RARE WBC PRESENT, PREDOMINANTLY MONONUCLEAR NO ORGANISMS SEEN    Culture   Final    NO GROWTH < 24 HOURS Performed at [facility], [place] [address] [place], [place] [address]    Report Status PENDING  Incomplete  Acid Fast Smear (AFB)     Status: None   Collection Time: 12/25/18 12:06 PM    Specimen: PATH Cytology Peritoneal fluid  Result Value Ref Range Status   AFB Specimen Processing Concentration  Final   Acid Fast Smear Negative  Final    Comment: (NOTE) Performed At: [facility] [address] [place], [place] [id number] Rush Farmer MD [id number]    Source (AFB) PERITONEAL  Final    Comment: Performed at [facility], [address] [place], [place] [address]    RADIOLOGY:  US Paracentesis  Result Date: 12/25/2018 INDICATION: Abdominal pain, ascites, jaundice EXAM: ULTRASOUND GUIDED  PARACENTESIS MEDICATIONS: Lidocaine 1% subcutaneous COMPLICATIONS: None immediate. PROCEDURE: Informed written consent was obtained from the patient after a discussion of the risks, benefits and alternatives to treatment. A timeout was performed prior to the initiation of the procedure. Initial ultrasound scanning demonstrates a moderate amount of ascites within the abdomen. The right lateral abdomen was prepped and draped in the usual sterile fashion. 1% lidocaine was used for local anesthesia. Following this, a Safe-T-Centesis catheter was introduced. An ultrasound image was saved for documentation purposes. The paracentesis was performed. The catheter was removed and a dressing was applied. The patient tolerated the procedure well without immediate post procedural complication. FINDINGS: A total of approximately 2.2 L of clear straw-colored fluid was removed. Samples were sent to the laboratory as requested by the clinical team. IMPRESSION: Successful ultrasound-guided paracentesis yielding 2.2 liters of peritoneal fluid. Electronically Signed   By: Lucrezia Europe M.D.   On: 12/25/2018 12:19     Management plans discussed with the patient, family and they are in agreement.  CODE STATUS: Full Code   TOTAL TIME TAKING CARE OF THIS PATIENT: 33 minutes.    Demetrios Loll M.D on 12/26/2018 at 11:12 AM  Between 7am to 6pm - Pager - [phone]  After 6pm go to  [email or web address] - Proofreader  [facility] [place] [facility]  Office  [phone]  CC: Primary care physician; Patient, No Pcp Per   Note: This dictation was prepared with Dragon dictation along with smaller phrase technology. Any transcriptional errors that result from this process are unintentional.

## 2018-12-26 NOTE — Evaluation (Signed)
Physical Therapy Evaluation Patient Details Name: Albert Castillo MRN: [id number] DOB: Dec 29, 1970 Today's Date: 12/26/2018   History of Present Illness  Patient is 48 yo male that presented to ED for abdominal pain/distention, underwent paracentisis during hospital admission. PMH of anxiety, depression, ETOH abuse.    Clinical Impression  Patient alert, oriented, impulsive during session. L arm abrasion noted, RN notified and bandage placed. The patient stated he is independent at baseline, has a friend that checks on him 1-2x a week, he drives, doesn't work. Reported his balance problems have been present for about a year.  The patient demonstrated bed mobility and transfers mod I. Socks and shoes donned at EOB, PT assisted though not required. Ambulated ~70ft with supervision and CGA. The patient did exhibit some unsteadiness, weaving gait, and decreased gait velocity, but no LOB noted. Pt expressed that as long as he moves slowly, his balance is better. Overall the patient demonstrated near return to baseline level of functioning, but would benefit from further skilled PT intervention for balance training, recommendation is outpatient PT.     Follow Up Recommendations Outpatient PT    Equipment Recommendations  None recommended by PT    Recommendations for Other Services       Precautions / Restrictions Precautions Precautions: Fall Restrictions Weight Bearing Restrictions: No      Mobility  Bed Mobility Overal bed mobility: Modified Independent                Transfers Overall transfer level: Modified independent Equipment used: None                Ambulation/Gait Ambulation/Gait assistance: Supervision;Min guard Gait Distance (Feet): 80 Feet Assistive device: None   Gait velocity: decreased   General Gait Details: Pt with some unsteadiness noted, weaving gait, but no LOB. able to turn without LOB.  Stairs            Wheelchair Mobility     Modified Rankin (Stroke Patients Only)       Balance Overall balance assessment: Needs assistance   Sitting balance-Leahy Scale: Normal       Standing balance-Leahy Scale: Good                               Pertinent Vitals/Pain Pain Assessment: No/denies pain    Home Living Family/patient expects to be discharged to:: Private residence Living Arrangements: Alone Available Help at Discharge: Friend(s);Available PRN/intermittently Type of Home: Mobile home Home Access: Stairs to enter Entrance Stairs-Rails: Psychiatric nurse of Steps: 3-5 Home Layout: One level Home Equipment: None      Prior Function Level of Independence: Independent         Comments: drives     Hand Dominance        Extremity/Trunk Assessment   Upper Extremity Assessment Upper Extremity Assessment: Defer to OT evaluation    Lower Extremity Assessment Lower Extremity Assessment: Generalized weakness    Cervical / Trunk Assessment Cervical / Trunk Assessment: Normal  Communication   Communication: No difficulties  Cognition Arousal/Alertness: Awake/alert Behavior During Therapy: WFL for tasks assessed/performed Overall Cognitive Status: Within Functional Limits for tasks assessed                                        General Comments      Exercises Other Exercises Other Exercises: donned  socks and shoes prior to mobility to improve pt stability, assisted by PT due to pt needing to apply pressure to arm bandaid.   Assessment/Plan    PT Assessment Patient needs continued PT services  PT Problem List Decreased mobility;Decreased safety awareness;Decreased balance       PT Treatment Interventions DME instruction;Therapeutic exercise;Gait training;Balance training;Stair training;Neuromuscular re-education;Functional mobility training;Patient/family education;Therapeutic activities    PT Goals (Current goals can be found in the  Care Plan section)  Acute Rehab PT Goals Patient Stated Goal: to get better PT Goal Formulation: With patient Time For Goal Achievement: 01/09/19 Potential to Achieve Goals: Good    Frequency Min 2X/week   Barriers to discharge        Co-evaluation               AM-PAC PT "6 Clicks" Mobility  Outcome Measure Help needed turning from your back to your side while in a flat bed without using bedrails?: None Help needed moving from lying on your back to sitting on the side of a flat bed without using bedrails?: None Help needed moving to and from a bed to a chair (including a wheelchair)?: None Help needed standing up from a chair using your arms (e.g., wheelchair or bedside chair)?: None Help needed to walk in hospital room?: None Help needed climbing 3-5 steps with a railing? : A Little 6 Click Score: 23    End of Session Equipment Utilized During Treatment: Gait belt Activity Tolerance: Patient tolerated treatment well Patient left: in bed;with call bell/phone within reach;with bed alarm set Nurse Communication: Mobility status PT Visit Diagnosis: Other abnormalities of gait and mobility (R26.89);Muscle weakness (generalized) (M62.81)    Time: 9390-3009 PT Time Calculation (min) (ACUTE ONLY): 14 min   Charges:   PT Evaluation $PT Eval Low Complexity: 1 Low          Olga Coaster PT, DPT 11:12 AM,12/26/18 [phone]

## 2018-12-26 NOTE — Progress Notes (Signed)
Discharged to home.  He will drive himself. All of his prescriptions have been printed for him.  Patient has no PCP.  He will have follow up with GI the first week of December.

## 2018-12-28 LAB — BODY FLUID CULTURE: Culture: NO GROWTH

## 2018-12-29 LAB — CYTOLOGY - NON PAP

## 2019-01-27 ENCOUNTER — Telehealth: Payer: Self-pay | Admitting: Gastroenterology

## 2019-01-27 NOTE — Telephone Encounter (Signed)
Pt left vm stating he has an apt with Dr. Allen Norris on 02/04/19 for Er f/u he states he is on his   last day rx this morning needs to know if he needs more please call pt

## 2019-01-27 NOTE — Telephone Encounter (Signed)
Spoke with pt this morning regarding refills. I advised pt typically we don't refill ER meds until appt, Scheduled for 02/04/19. Pt stated fluid is gone from his abdomen. I advised pt I would need to speak with you first regarding refills. Lasix, spironolactone, pantoprazole, Thiamine, folic acid. Please advise.

## 2019-02-03 ENCOUNTER — Ambulatory Visit: Payer: Self-pay | Admitting: Gerontology

## 2019-02-04 ENCOUNTER — Other Ambulatory Visit: Payer: Self-pay

## 2019-02-04 ENCOUNTER — Other Ambulatory Visit
Admission: RE | Admit: 2019-02-04 | Discharge: 2019-02-04 | Disposition: A | Discharge status: Home / Self Care | Payer: Self-pay | Attending: Gastroenterology | Admitting: Gastroenterology

## 2019-02-04 ENCOUNTER — Ambulatory Visit (INDEPENDENT_AMBULATORY_CARE_PROVIDER_SITE_OTHER): Payer: Self-pay | Admitting: Gastroenterology

## 2019-02-04 ENCOUNTER — Encounter: Payer: Self-pay | Admitting: Gastroenterology

## 2019-02-04 VITALS — BP 99/62 | HR 73 | Temp 98.5°F | Ht 72.0 in | Wt 175.6 lb

## 2019-02-04 DIAGNOSIS — K7031 Alcoholic cirrhosis of liver with ascites: Secondary | ICD-10-CM

## 2019-02-04 LAB — COMPREHENSIVE METABOLIC PANEL
ALT: 33 U/L (ref 0–44)
AST: 50 U/L — ABNORMAL HIGH (ref 15–41)
Albumin: 2.2 g/dL — ABNORMAL LOW (ref 3.5–5.0)
Alkaline Phosphatase: 166 U/L — ABNORMAL HIGH (ref 38–126)
Anion gap: 5 (ref 5–15)
BUN: 7 mg/dL (ref 6–20)
CO2: 23 mmol/L (ref 22–32)
Calcium: 7.9 mg/dL — ABNORMAL LOW (ref 8.9–10.3)
Chloride: 105 mmol/L (ref 98–111)
Creatinine, Ser: 0.87 mg/dL (ref 0.61–1.24)
GFR calc Af Amer: >60 mL/min (ref >60)
GFR calc non Af Amer: >60 mL/min (ref >60)
Glucose, Bld: 101 mg/dL — ABNORMAL HIGH (ref 70–99)
Potassium: 3.6 mmol/L (ref 3.5–5.1)
Sodium: 133 mmol/L — ABNORMAL LOW (ref 135–145)
Total Bilirubin: 3.1 mg/dL — ABNORMAL HIGH (ref 0.3–1.2)
Total Protein: 6.6 g/dL (ref 6.5–8.1)

## 2019-02-04 LAB — PROTIME-INR
INR: 1.7 — ABNORMAL HIGH (ref 0.8–1.2)
Prothrombin Time: 20 seconds — ABNORMAL HIGH (ref 11.4–15.2)

## 2019-02-04 MED ORDER — FUROSEMIDE 40 MG PO TABS: 40.0000 mg | ORAL_TABLET | Freq: Every day | ORAL | 3 refills | Status: DC

## 2019-02-04 MED ORDER — SPIRONOLACTONE 25 MG PO TABS: 25.0000 mg | ORAL_TABLET | Freq: Every day | ORAL | 3 refills | Status: DC

## 2019-02-04 NOTE — Progress Notes (Signed)
Primary Care Physician: Patient, No Pcp Per  Primary Gastroenterologist:  Dr. [name]  Chief Complaint  Patient presents with  . Hospitalization Follow-up  . alcoholic cirrhosis    HPI: Albert Castillo is a 48 y.o. male here for follow-up of alcoholic cirrhosis.  The patient was recently in the hospital and seen by me in consultation for ascites with abdominal distention abdominal pain.  The patient had the fluid removed and reported that his pain was much better and was subsequently discharged.  He was then told to follow-up with me as an outpatient.  During the patient's hospital stay his ascitic fluid was sent off for cytology which was negative.  The AFB was also negative and the patient's white cell count was 310 with only 10% neutrophils.  This was not consistent with SBP.  The patient reports that he has ran out of his medication as far as his diuretics are concerned.  He feels that he is slightly bigger than he was then when he was taking the diuretics.  He also states that he still shaky on his feet but has not drank anything in 48 days.  Current Outpatient Medications  Medication Sig Dispense Refill  . folic acid (FOLVITE) 1 MG tablet Take 1 tablet (1 mg total) by mouth daily. (Patient not taking: Reported on 02/04/2019) 30 tablet 0  . furosemide (LASIX) 40 MG tablet Take 1 tablet (40 mg total) by mouth daily. (Patient not taking: Reported on 02/04/2019) 30 tablet 1  . Multiple Vitamin (MULTIVITAMIN WITH MINERALS) TABS tablet Take 1 tablet by mouth daily. (Patient not taking: Reported on 02/04/2019) 30 tablet 1  . nicotine (NICODERM CQ - DOSED IN MG/24 HOURS) 14 mg/24hr patch Place 1 patch (14 mg total) onto the skin daily. (Patient not taking: Reported on 02/04/2019) 28 patch 0  . pantoprazole (PROTONIX) 40 MG tablet Take 1 tablet (40 mg total) by mouth daily. (Patient not taking: Reported on 02/04/2019) 30 tablet 0  . spironolactone (ALDACTONE) 25 MG tablet Take 1 tablet (25 mg  total) by mouth daily. (Patient not taking: Reported on 02/04/2019) 30 tablet 1  . thiamine 100 MG tablet Take 1 tablet (100 mg total) by mouth daily. (Patient not taking: Reported on 02/04/2019) 30 tablet 0   No current facility-administered medications for this visit.     Allergies as of 02/04/2019  . (No Known Allergies)    ROS:  General: Negative for anorexia, weight loss, fever, chills, fatigue, weakness. ENT: Negative for hoarseness, difficulty swallowing , nasal congestion. CV: Negative for chest pain, angina, palpitations, dyspnea on exertion, peripheral edema.  Respiratory: Negative for dyspnea at rest, dyspnea on exertion, cough, sputum, wheezing.  GI: See history of present illness. GU:  Negative for dysuria, hematuria, urinary incontinence, urinary frequency, nocturnal urination.  Endo: Negative for unusual weight change.    Physical Examination:   BP 99/62   Pulse 73   Temp 98.5 F (36.9 C) (Temporal)   Ht 6' (1.829 m)   Wt 175 lb 9.6 oz (79.7 kg)   BMI 23.82 kg/m   General: Well-nourished, well-developed in no acute distress.  Eyes: No icterus. Conjunctivae pink. Lungs: Clear to auscultation bilaterally. Non-labored. Heart: Regular rate and rhythm, no murmurs rubs or gallops.  Abdomen: Bowel sounds are normal, nontender, nondistended, no hepatosplenomegaly or masses, no abdominal bruits or hernia , no rebound or guarding.   Extremities: No lower extremity edema. No clubbing or deformities. Neuro: Alert and oriented x 3.  Grossly intact.  Skin: Warm and dry, no jaundice.   Psych: Alert and cooperative, normal mood and affect.  Labs:    Imaging Studies: No results found.  Assessment and Plan:   Albert Castillo is a 48 y.o. y/o male comes in today with a history of alcoholic cirrhosis.  The patient will have his lab sent off for alpha-fetoprotein.  The patient has thrombocytopenia likely from his portal hypertension.  The patient will also be set up for an EGD  to rule out esophageal varices and he will also be set up for colonoscopy due to thickening of the cecum and ascending colon on his CT scan when he was in the hospital.  He will have his lab sent off to calculate his meld score and will have a renal function test done and repeated in 1 week so that we know if the diuretics are causing any renal insufficiency.  The patient has been explained the plan and agrees with it.     Midge Minium, MD. Clementeen Graham    Note: This dictation was prepared with Dragon dictation along with smaller phrase technology. Any transcriptional errors that result from this process are unintentional.

## 2019-02-05 ENCOUNTER — Telehealth: Payer: Self-pay

## 2019-02-05 ENCOUNTER — Other Ambulatory Visit: Payer: Self-pay

## 2019-02-05 DIAGNOSIS — K7031 Alcoholic cirrhosis of liver with ascites: Secondary | ICD-10-CM

## 2019-02-05 LAB — AFP TUMOR MARKER: AFP, Serum, Tumor Marker: 4.5 ng/mL (ref 0.0–8.3)

## 2019-02-05 NOTE — Telephone Encounter (Signed)
Pt notified of lab results

## 2019-02-05 NOTE — Telephone Encounter (Signed)
-----   Message from [name], MD sent at 02/05/2019  8:23 AM EST ----- Let the patient know that his liver enzymes and blood work is still abnormal but much improved from what they were before.  His tumor marker for liver cancer was negative.  He should continue to follow-up with the blood work next week to make sure his kidneys are doing well and follow-up with me in 3 months if he is stable.

## 2019-02-06 LAB — ACID FAST CULTURE WITH REFLEXED SENSITIVITIES (MYCOBACTERIA): Acid Fast Culture: NEGATIVE

## 2019-02-11 ENCOUNTER — Other Ambulatory Visit: Payer: Self-pay

## 2019-02-11 ENCOUNTER — Other Ambulatory Visit
Admission: RE | Admit: 2019-02-11 | Discharge: 2019-02-11 | Disposition: A | Discharge status: Home / Self Care | Payer: Self-pay | Attending: Gastroenterology | Admitting: Gastroenterology

## 2019-02-11 DIAGNOSIS — K7031 Alcoholic cirrhosis of liver with ascites: Secondary | ICD-10-CM | POA: Insufficient documentation

## 2019-02-11 LAB — BASIC METABOLIC PANEL
Anion gap: 6 (ref 5–15)
BUN: 7 mg/dL (ref 6–20)
CO2: 23 mmol/L (ref 22–32)
Calcium: 8.4 mg/dL — ABNORMAL LOW (ref 8.9–10.3)
Chloride: 104 mmol/L (ref 98–111)
Creatinine, Ser: 1.09 mg/dL (ref 0.61–1.24)
GFR calc Af Amer: >60 mL/min (ref >60)
GFR calc non Af Amer: >60 mL/min (ref >60)
Glucose, Bld: 103 mg/dL — ABNORMAL HIGH (ref 70–99)
Potassium: 3.6 mmol/L (ref 3.5–5.1)
Sodium: 133 mmol/L — ABNORMAL LOW (ref 135–145)

## 2019-02-16 ENCOUNTER — Telehealth: Payer: Self-pay

## 2019-02-16 NOTE — Telephone Encounter (Signed)
-----   Message from [name], MD sent at 02/14/2019  8:34 AM EST ----- Let the patient know that his kidney function is still within normal limits and if his diuretics are controlling his ascites we should continue this dose.  Ascites is not better than we can gently increase his diuretics.

## 2019-02-16 NOTE — Telephone Encounter (Signed)
Pt notified of results. Pt stated he is doing very well on his current medications.

## 2019-03-09 ENCOUNTER — Telehealth: Payer: Self-pay | Admitting: Gastroenterology

## 2019-03-09 ENCOUNTER — Other Ambulatory Visit: Payer: Self-pay

## 2019-03-09 MED ORDER — SPIRONOLACTONE 25 MG PO TABS: 25.0000 mg | ORAL_TABLET | Freq: Every day | ORAL | 3 refills | Status: DC

## 2019-03-09 MED ORDER — FUROSEMIDE 40 MG PO TABS: 40.0000 mg | ORAL_TABLET | Freq: Every day | ORAL | 3 refills | Status: DC

## 2019-03-09 NOTE — Telephone Encounter (Signed)
Spoke with pt regarding his refills on Lasix and Spironolactone. Rx sent to [facility].

## 2019-03-09 NOTE — Telephone Encounter (Signed)
Pt left vm he states he will be taking his last medication  For his Liver and Kidney please call pt

## 2019-03-10 ENCOUNTER — Other Ambulatory Visit: Payer: Self-pay

## 2019-03-10 ENCOUNTER — Telehealth: Payer: Self-pay | Admitting: Gastroenterology

## 2019-03-10 ENCOUNTER — Other Ambulatory Visit
Admission: RE | Admit: 2019-03-10 | Discharge: 2019-03-10 | Disposition: A | Discharge status: Home / Self Care | Payer: Self-pay | Attending: Gastroenterology | Admitting: Gastroenterology

## 2019-03-10 DIAGNOSIS — K7031 Alcoholic cirrhosis of liver with ascites: Secondary | ICD-10-CM | POA: Insufficient documentation

## 2019-03-10 LAB — CBC WITH DIFFERENTIAL/PLATELET
Abs Immature Granulocytes: 0.02 10*3/uL (ref 0.00–0.07)
Basophils Absolute: 0.1 10*3/uL (ref 0.0–0.1)
Basophils Relative: 1 %
Eosinophils Absolute: 0.4 10*3/uL (ref 0.0–0.5)
Eosinophils Relative: 8 %
HCT: 29.5 % — ABNORMAL LOW (ref 39.0–52.0)
Hemoglobin: 10.3 g/dL — ABNORMAL LOW (ref 13.0–17.0)
Immature Granulocytes: 0 %
Lymphocytes Relative: 40 %
Lymphs Abs: 2.2 10*3/uL (ref 0.7–4.0)
MCH: 30.1 pg (ref 26.0–34.0)
MCHC: 34.9 g/dL (ref 30.0–36.0)
MCV: 86.3 fL (ref 80.0–100.0)
Monocytes Absolute: 0.7 10*3/uL (ref 0.1–1.0)
Monocytes Relative: 12 %
Neutro Abs: 2.2 10*3/uL (ref 1.7–7.7)
Neutrophils Relative %: 39 %
Platelets: 81 10*3/uL — ABNORMAL LOW (ref 150–400)
RBC: 3.42 MIL/uL — ABNORMAL LOW (ref 4.22–5.81)
RDW: 19.7 % — ABNORMAL HIGH (ref 11.5–15.5)
WBC: 5.7 10*3/uL (ref 4.0–10.5)
nRBC: 0 % (ref 0.0–0.2)

## 2019-03-10 LAB — BASIC METABOLIC PANEL
Anion gap: 3 — ABNORMAL LOW (ref 5–15)
BUN: 8 mg/dL (ref 6–20)
CO2: 24 mmol/L (ref 22–32)
Calcium: 8.3 mg/dL — ABNORMAL LOW (ref 8.9–10.3)
Chloride: 108 mmol/L (ref 98–111)
Creatinine, Ser: 0.85 mg/dL (ref 0.61–1.24)
GFR calc Af Amer: >60 mL/min (ref >60)
GFR calc non Af Amer: >60 mL/min (ref >60)
Glucose, Bld: 101 mg/dL — ABNORMAL HIGH (ref 70–99)
Potassium: 3.7 mmol/L (ref 3.5–5.1)
Sodium: 135 mmol/L (ref 135–145)

## 2019-03-10 NOTE — Telephone Encounter (Signed)
Pt states he is out of medication and needs to know if he was supposed to continue or stop please call pt

## 2019-03-10 NOTE — Telephone Encounter (Signed)
Yes. You can go up on his aldactone to 50mg  and Lasix to 40 mg twice a day.

## 2019-03-10 NOTE — Telephone Encounter (Signed)
Spoke with pt and he stated his legs are swelling a little bit more and his belly is getting a little more distended. Can we increase his meds? Please advise.

## 2019-03-11 ENCOUNTER — Other Ambulatory Visit: Payer: Self-pay

## 2019-03-11 DIAGNOSIS — K7031 Alcoholic cirrhosis of liver with ascites: Secondary | ICD-10-CM

## 2019-03-11 MED ORDER — SPIRONOLACTONE 25 MG PO TABS: 25.0000 mg | ORAL_TABLET | Freq: Two times a day (BID) | ORAL | 3 refills | Status: AC

## 2019-03-11 MED ORDER — FUROSEMIDE 40 MG PO TABS: 40.0000 mg | ORAL_TABLET | Freq: Two times a day (BID) | ORAL | 3 refills | Status: DC

## 2019-03-11 NOTE — Telephone Encounter (Signed)
Pt notified ok to increase medications. Pt also aware he will need to repeat his labs next Wednesday. Pt verbalized understanding.

## 2019-03-11 NOTE — Addendum Note (Signed)
Addended byRayann Heman E on: 03/11/2019 03:57 PM   Modules accepted: Orders

## 2019-03-18 ENCOUNTER — Other Ambulatory Visit: Payer: Self-pay

## 2019-03-18 ENCOUNTER — Other Ambulatory Visit
Admission: RE | Admit: 2019-03-18 | Discharge: 2019-03-18 | Disposition: A | Discharge status: Home / Self Care | Payer: Self-pay | Attending: Gastroenterology | Admitting: Gastroenterology

## 2019-03-18 DIAGNOSIS — K7031 Alcoholic cirrhosis of liver with ascites: Secondary | ICD-10-CM | POA: Insufficient documentation

## 2019-03-18 LAB — BASIC METABOLIC PANEL
Anion gap: 5 (ref 5–15)
BUN: 9 mg/dL (ref 6–20)
CO2: 27 mmol/L (ref 22–32)
Calcium: 8.2 mg/dL — ABNORMAL LOW (ref 8.9–10.3)
Chloride: 102 mmol/L (ref 98–111)
Creatinine, Ser: 1.07 mg/dL (ref 0.61–1.24)
GFR calc Af Amer: >60 mL/min (ref >60)
GFR calc non Af Amer: >60 mL/min (ref >60)
Glucose, Bld: 99 mg/dL (ref 70–99)
Potassium: 3.5 mmol/L (ref 3.5–5.1)
Sodium: 134 mmol/L — ABNORMAL LOW (ref 135–145)

## 2019-03-19 ENCOUNTER — Telehealth: Payer: Self-pay

## 2019-03-19 NOTE — Telephone Encounter (Signed)
Pt notified of results

## 2019-03-19 NOTE — Telephone Encounter (Signed)
-----   Message from Midge Minium, MD sent at 03/18/2019  6:06 PM EST ----- Let the patient know that the kidney function is doing well and still normal.

## 2019-03-26 ENCOUNTER — Other Ambulatory Visit: Payer: Self-pay

## 2019-03-26 MED ORDER — FUROSEMIDE 40 MG PO TABS: 40.0000 mg | ORAL_TABLET | Freq: Two times a day (BID) | ORAL | 6 refills | Status: AC

## 2019-08-04 ENCOUNTER — Ambulatory Visit: Payer: Self-pay

## 2021-08-01 IMAGING — US US PARACENTESIS
1 series · 6 of 6 positions shown · non-contrast
Comparison: none

INDICATION: Abdominal pain, ascites, jaundice

[Series 1: us paracentesis · 0.28mm/px · 6 of 6 slices shown]
[im 1/6]
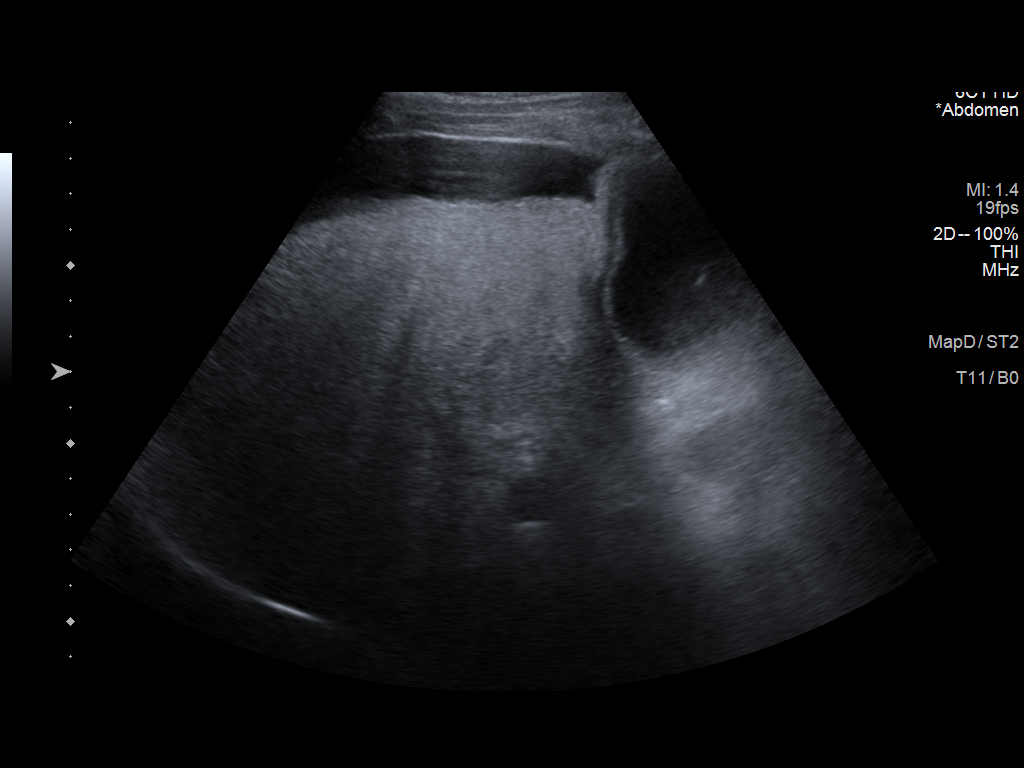
[im 2/6]
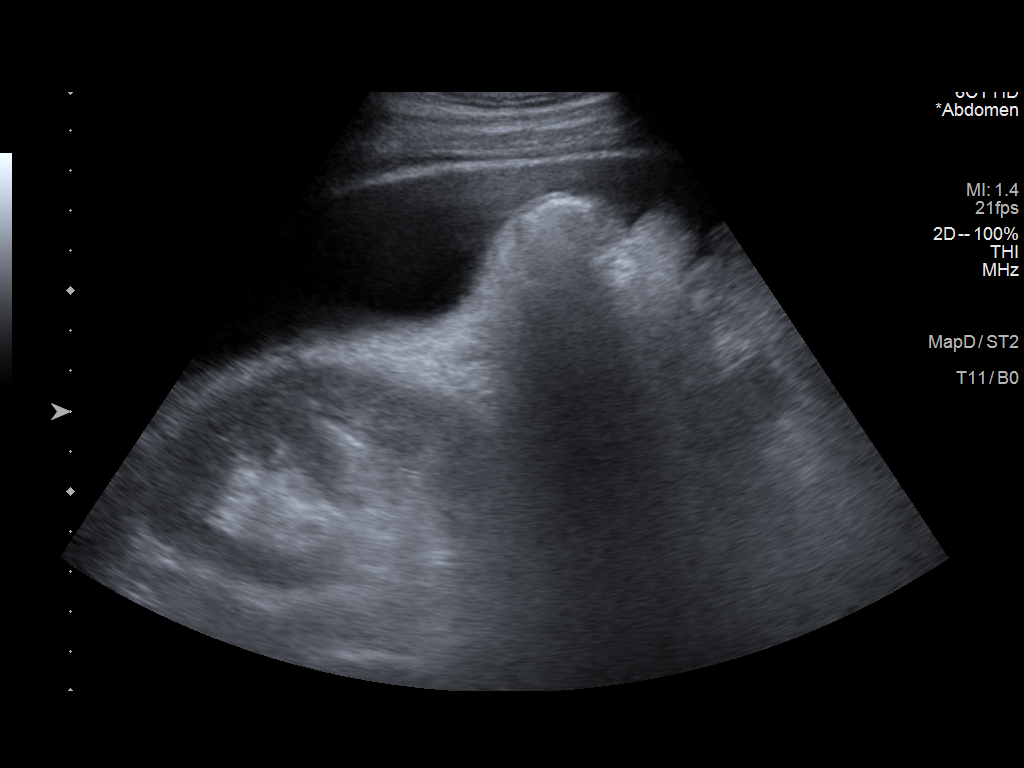
[im 3/6]
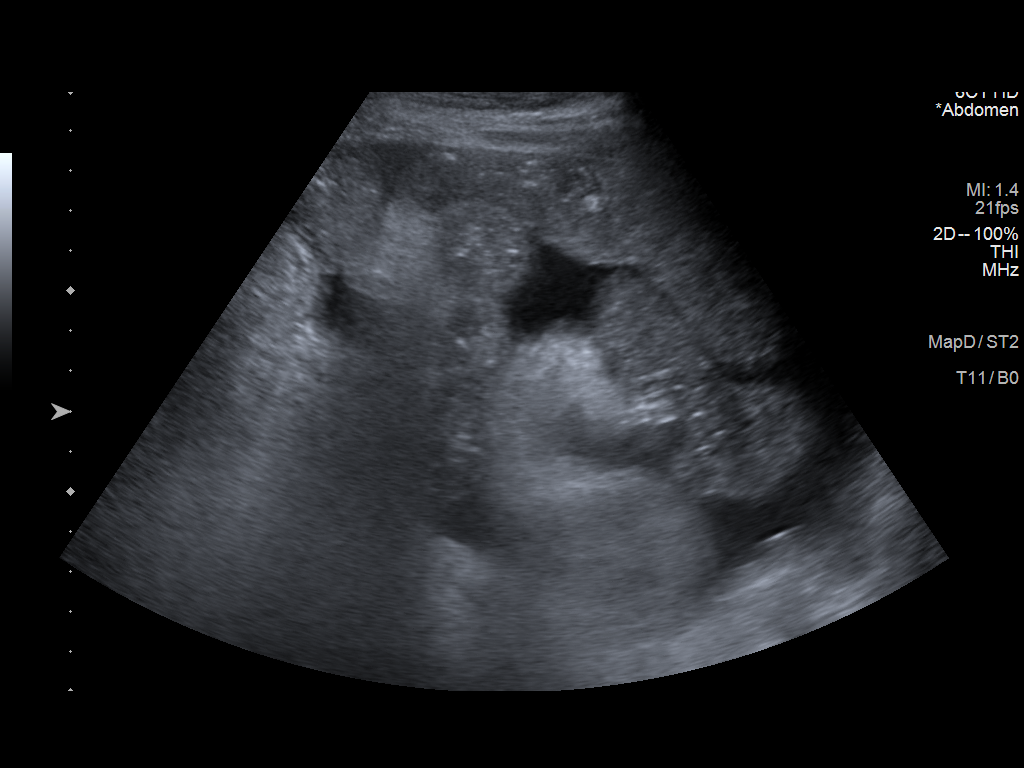
[im 4/6]
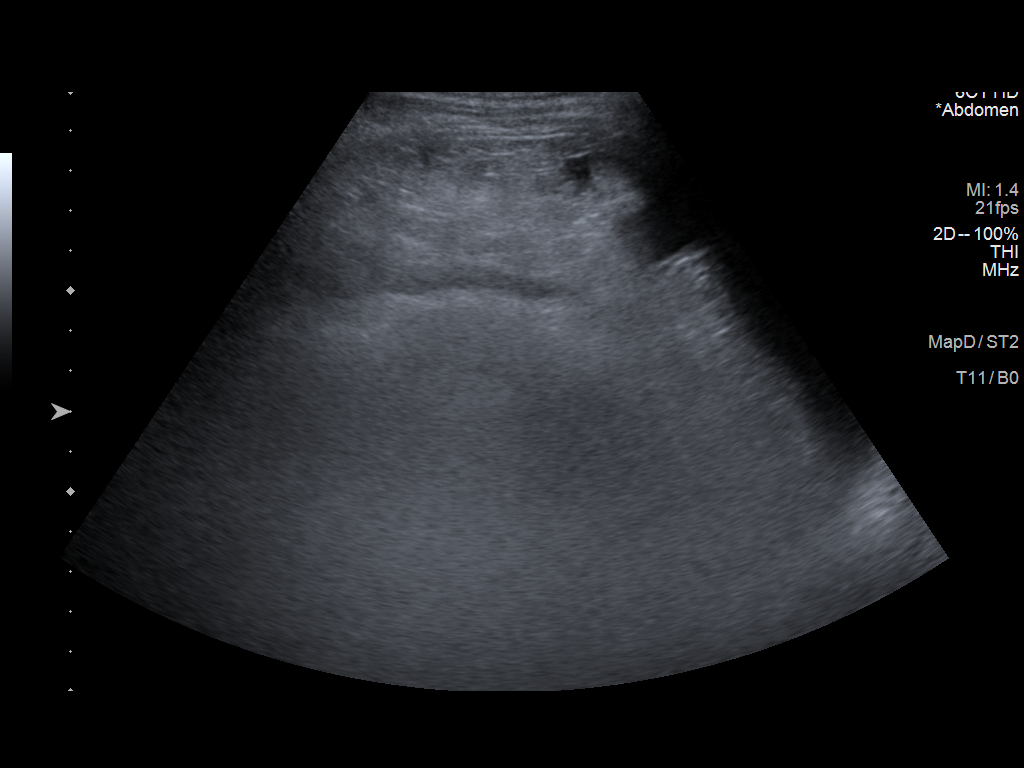
[im 5/6]
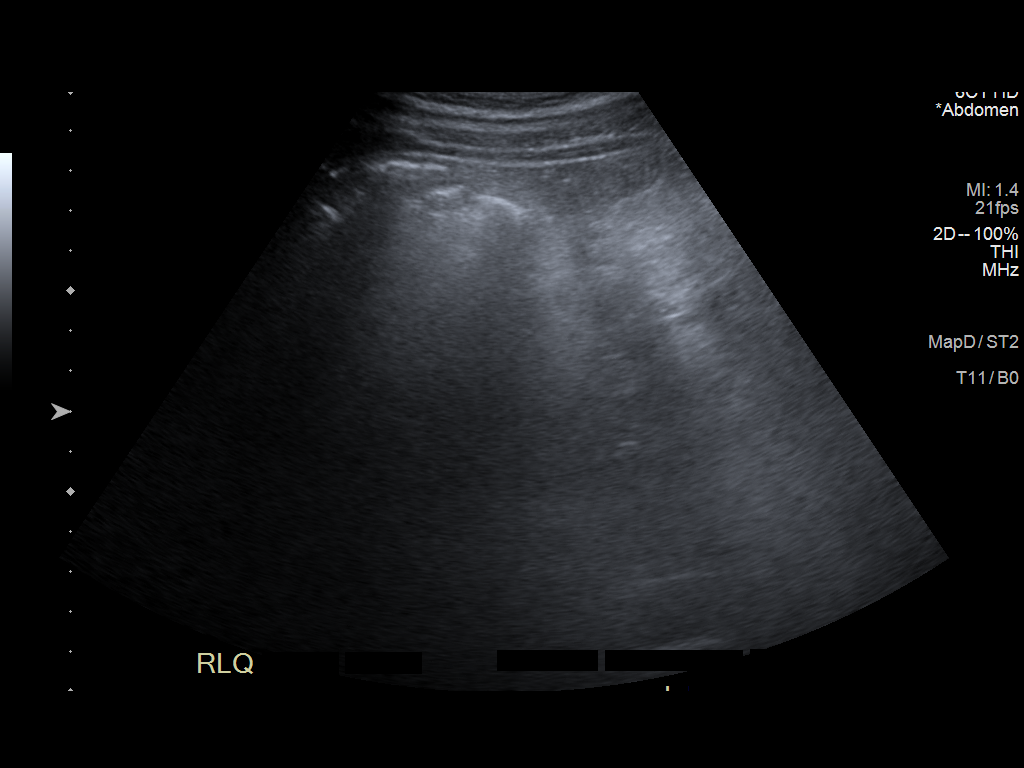
[im 6/6]
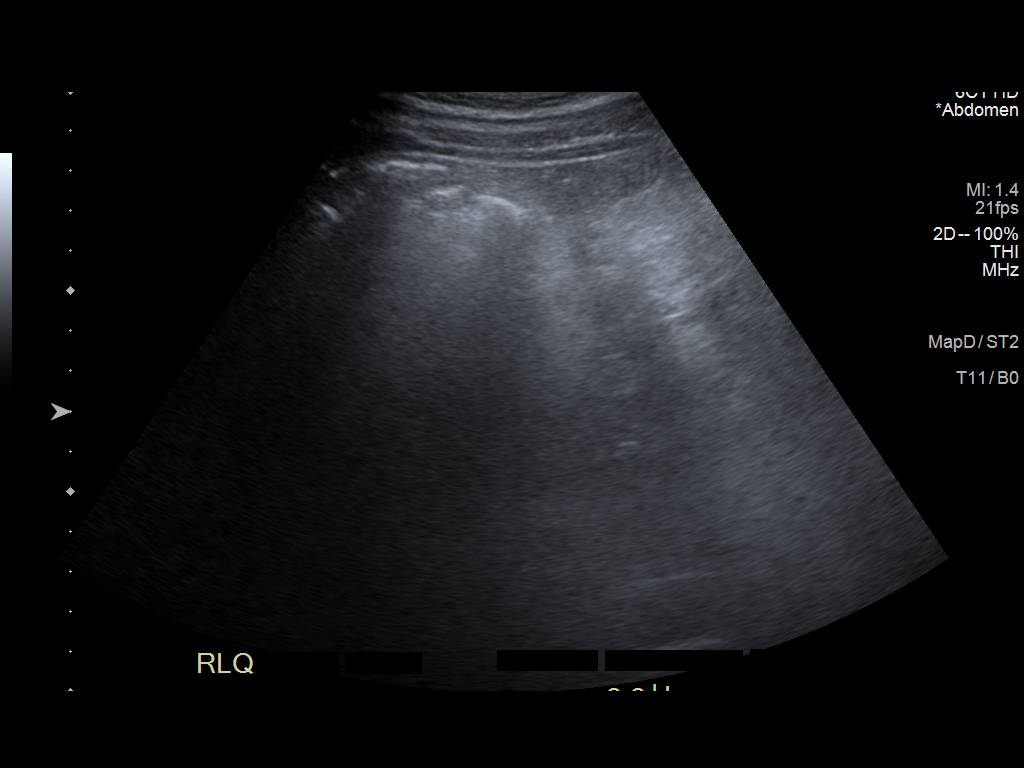

[6 of 6 positions shown; findings below may reference images not displayed]

EXAM:
ULTRASOUND GUIDED  PARACENTESIS

MEDICATIONS:
Lidocaine 1% subcutaneous

COMPLICATIONS:
None immediate.

PROCEDURE:
Informed written consent was obtained from the patient after a
discussion of the risks, benefits and alternatives to treatment. A
timeout was performed prior to the initiation of the procedure.

Initial ultrasound scanning demonstrates a moderate amount of
ascites within the abdomen. The right lateral abdomen was prepped
and draped in the usual sterile fashion. 1% lidocaine was used for
local anesthesia.

Following this, a Safe-T-Centesis catheter was introduced. An
ultrasound image was saved for documentation purposes. The
paracentesis was performed. The catheter was removed and a dressing
was applied. The patient tolerated the procedure well without
immediate post procedural complication.
FINDINGS: A total of approximately 2.2 L of clear straw-colored fluid was
removed. Samples were sent to the laboratory as requested by the
clinical team.
IMPRESSION: Successful ultrasound-guided paracentesis yielding 2.2 liters of
peritoneal fluid.

## 2021-08-01 IMAGING — CT CT ABD-PELV W/ CM
2 of 5 series · 15 of 46 positions shown, 17 images · IV contrast (omnipaque)
Comparison: None.

CLINICAL DATA: Acute abdominal pain. Nausea and vomiting. No
ascites. Jaundice.

EXAM:
CT ABDOMEN AND PELVIS WITH CONTRAST
TECHNIQUE: Multidetector CT imaging of the abdomen and pelvis was performed
using the standard protocol following bolus administration of
intravenous contrast.
CONTRAST:  100mL OMNIPAQUE IOHEXOL 300 MG/ML  SOLN

[Series 2: axial st · axial · 0.88mm/px · z∈[-1047,-552]mm · 12 of 111 slices shown, 14 images]
[im 6/111  soft-tissue]
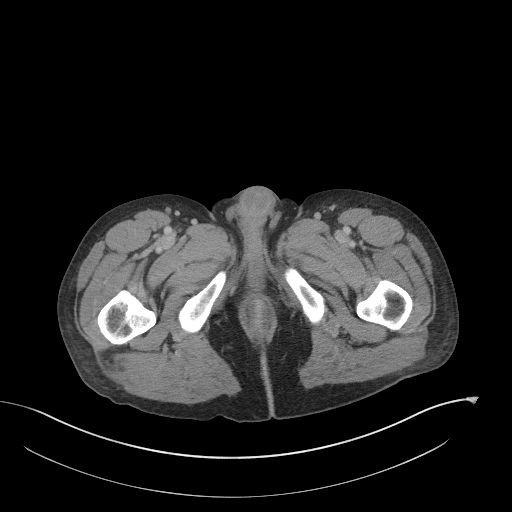
[im 6/111  bone]
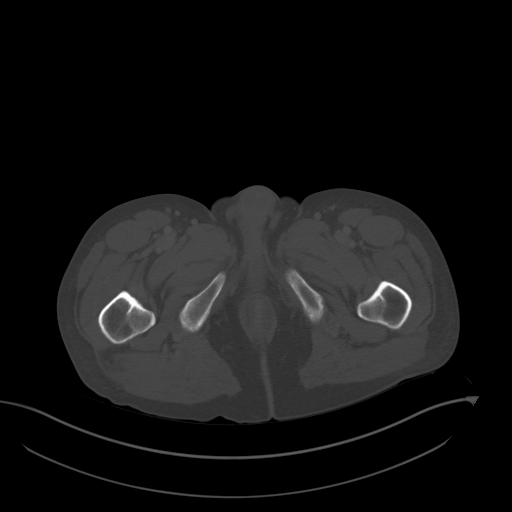
[im 17/111  soft-tissue]
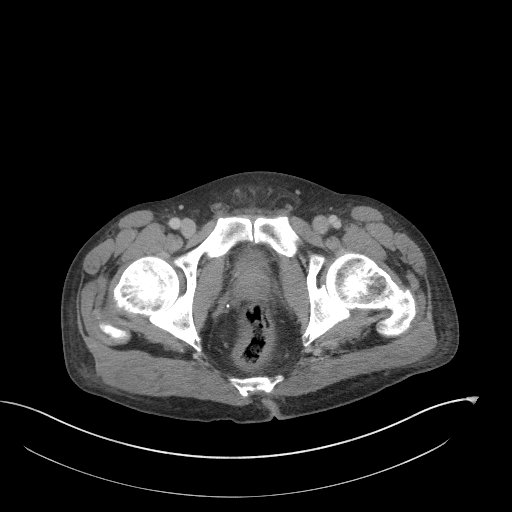
[im 23/111  soft-tissue]
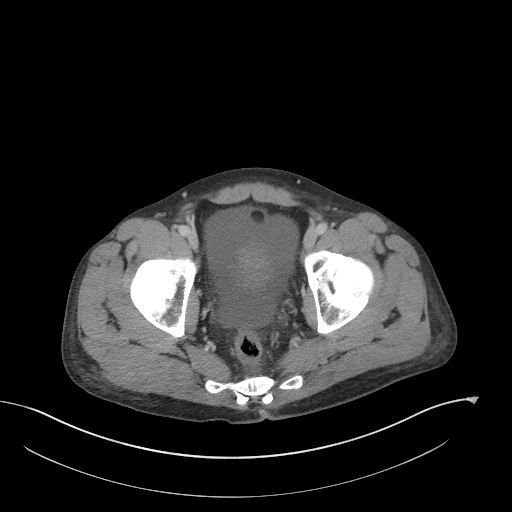
[im 34/111  soft-tissue]
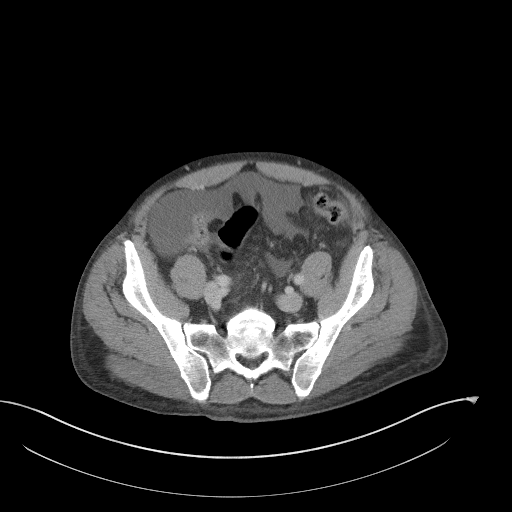
[im 45/111  soft-tissue]
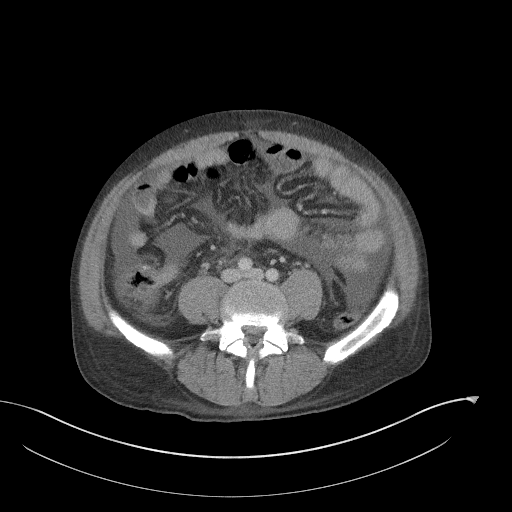
[im 50/111  soft-tissue]
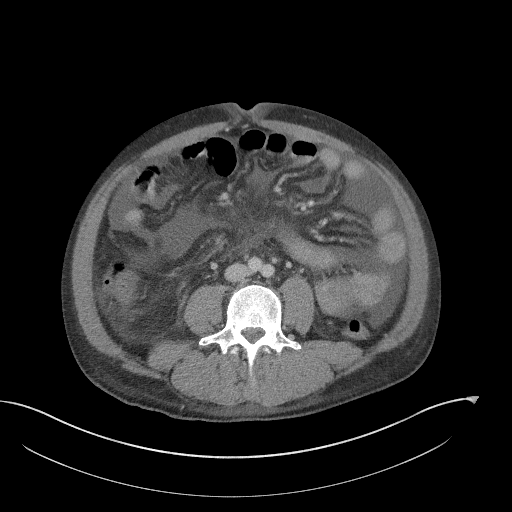
[im 61/111  soft-tissue]
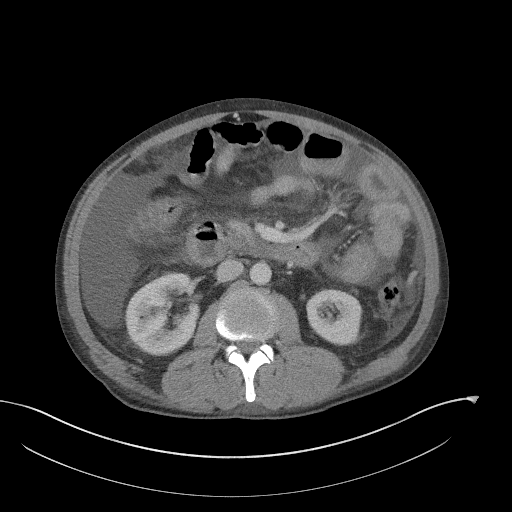
[im 67/111  soft-tissue]
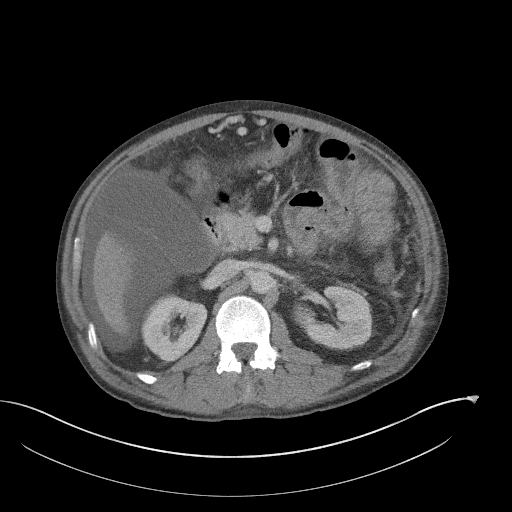
[im 78/111  soft-tissue]
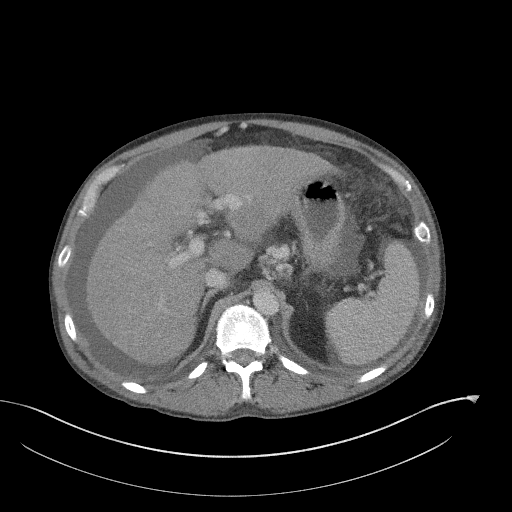
[im 78/111  bone]
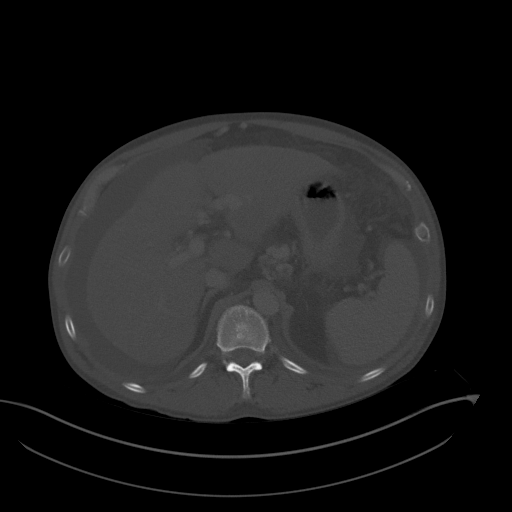
[im 89/111  soft-tissue]
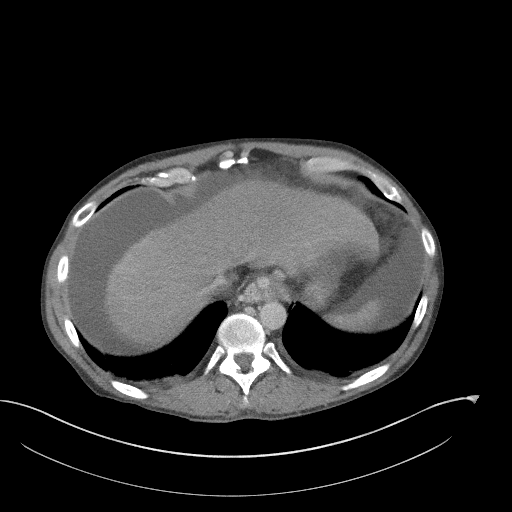
[im 94/111  soft-tissue]
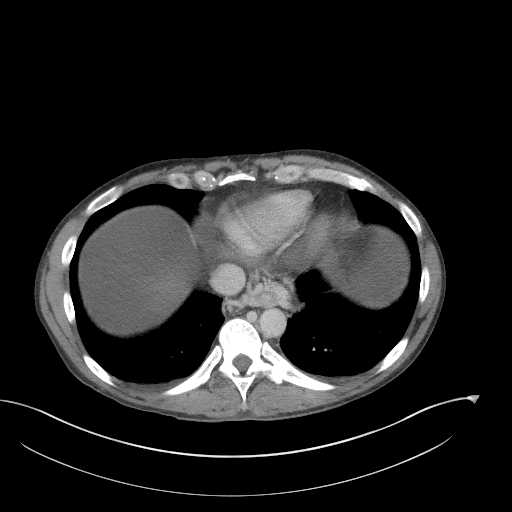
[im 105/111  soft-tissue]
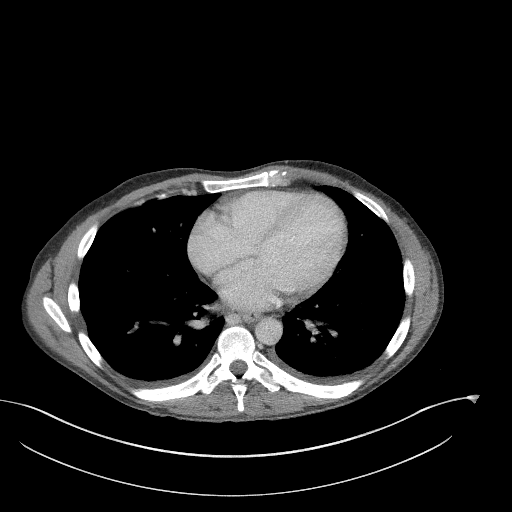

[Series 6: coronal st · coronal · 0.75mm/px · 3 of 101 slices shown]
[im 34/101  soft-tissue]
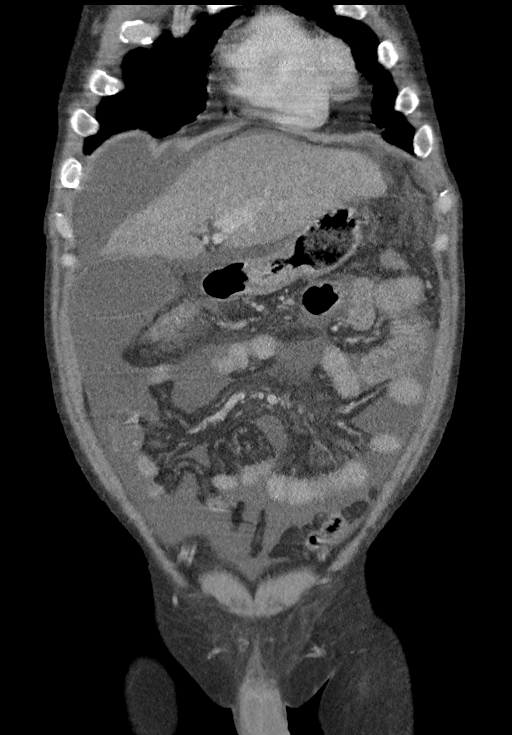
[im 45/101  soft-tissue]
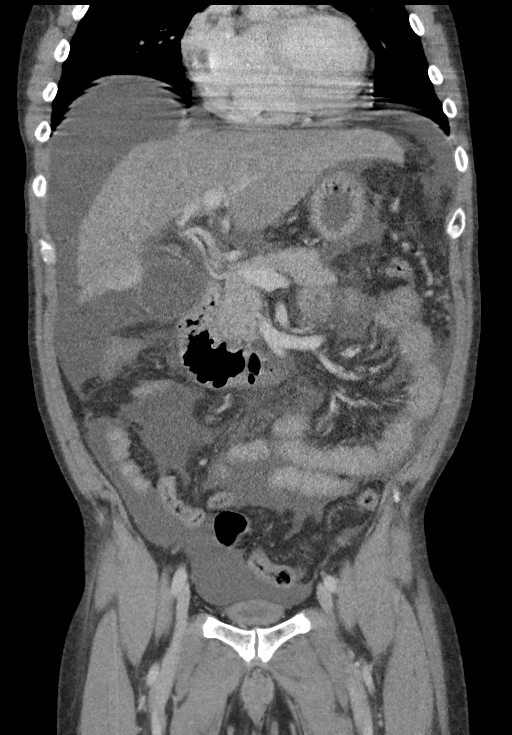
[im 56/101  soft-tissue]
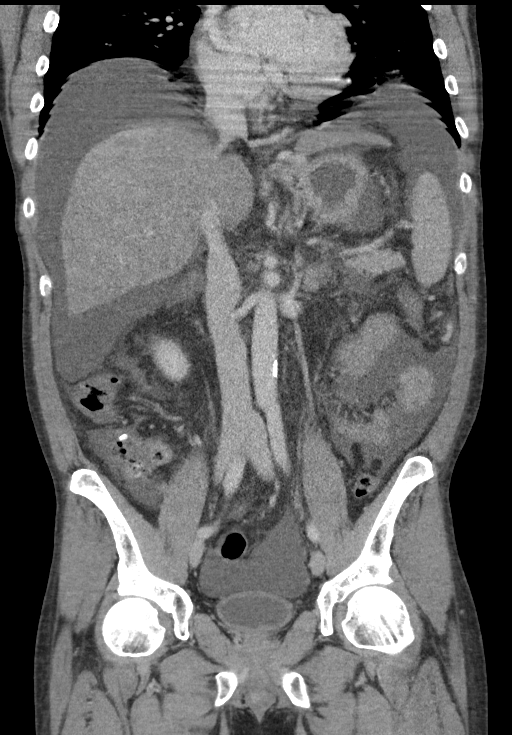

[15 of 46 positions shown; findings below may reference images not displayed]

FINDINGS: Lower chest: Trace bilateral pleural effusions and adjacent
atelectasis. Large paraesophageal varices.

Hepatobiliary: Cirrhotic hepatic morphology with nodular contours.
Diffusely heterogeneous liver parenchyma. No evidence of focal mass.
Prominently distended gallbladder without calcified gallstone. No
biliary dilatation. Larger recannulated umbilical vein. No evidence
of portal vein thrombosis.

Pancreas: No ductal dilatation or inflammation.

Spleen: No splenomegaly, spleen measures 11 cm greatest dimension.
No focal splenic abnormality.

Adrenals/Urinary Tract: Normal adrenal glands. No hydronephrosis or
perinephric edema. Homogeneous renal enhancement with symmetric
excretion on delayed phase imaging. Urinary bladder is partially
distended without wall thickening.

Stomach/Bowel: Large paraesophageal varices. Mild wall thickening
about the distal stomach. Small bowel fused diffusely slightly
edematous. No obstruction. Normal appendix. There is wall thickening
of the cecum and ascending colon. Minimal diverticulosis without
diverticulitis.

Vascular/Lymphatic: Mild aortic atherosclerosis. No aneurysm. No
evidence of portal vein thrombosis. Recannulated umbilical vein.
Splenic vein is not well-defined adjacent to the pancreatic tail. No
enlarged lymph nodes in the abdomen or pelvis.

Reproductive: Prostate is unremarkable.

Other: Moderate volume abdominopelvic and mesenteric ascites. Small
fat containing umbilical hernia with adjacent edema. No free air. No
organized fluid collection.

Musculoskeletal: There are no acute or suspicious osseous
abnormalities. Degenerative disc disease at L5-S1.
IMPRESSION: 1. Hepatic cirrhosis. Moderate volume abdominopelvic ascites. Portal
hypertension with recannulated umbilical vein and large
paraesophageal varices. No splenomegaly.
2. Wall thickening of the cecum and ascending colon, likely
secondary to portal colopathy.
3. Trace bilateral pleural effusions and adjacent atelectasis.

Aortic Atherosclerosis ([id number]).
